# Patient Record
Sex: Female | Born: 1980 | Race: Black or African American | Hispanic: No | Marital: Married | Smoking: Never smoker
Health system: Southern US, Community
[De-identification: ages and names within clinical notes are randomized; demographics above are authoritative.]

## PROBLEM LIST (undated history)

## (undated) DIAGNOSIS — B999 Unspecified infectious disease: Secondary | ICD-10-CM

## (undated) HISTORY — DX: Unspecified infectious disease: B99.9

---

## 2011-07-04 HISTORY — PX: WISDOM TOOTH EXTRACTION: SHX21

## 2011-07-30 ENCOUNTER — Ambulatory Visit (INDEPENDENT_AMBULATORY_CARE_PROVIDER_SITE_OTHER): Payer: 59

## 2011-07-30 DIAGNOSIS — M25569 Pain in unspecified knee: Secondary | ICD-10-CM

## 2011-07-30 DIAGNOSIS — K6289 Other specified diseases of anus and rectum: Secondary | ICD-10-CM

## 2012-05-20 ENCOUNTER — Encounter: Payer: Self-pay | Admitting: Obstetrics and Gynecology

## 2012-05-20 NOTE — Progress Notes (Signed)
Pt presented to office. Was scheduled for NOB interview.  LMP 05/08/12. Pt is not pregnant.  Thought prenatal meant prior to pregnancy. Denies any problems currently or with previous pregnancy. Questions if should take prenatal vitamins and advised may take OTC. Questioning if needed lab tests prior to pregnancy.  Offered pt New GYN or preconception consult.  Pt declined appt.  Will call with +UPT or any questions.

## 2012-07-03 NOTE — L&D Delivery Note (Signed)
Delivery Note At 10:38 AM a viable female was delivered via Vaginal, Spontaneous Delivery (Presentation: Right Occiput Anterior).  APGAR: 8, 9; weight 9 lb 0.1 oz (4085 g).   Placenta status: Intact, Spontaneous.  Cord: 3 vessels with the following complications: None.  Cord pH: n/a  Anesthesia: Epidural  Episiotomy: None Lacerations: 2nd degree;Perineal Suture Repair: 2.0 3.0 vicryl Est. Blood Loss (mL): 300  Mom to postpartum.  Baby to nursery-stable.  Purcell Nails 03/10/2013, 12:16 PM

## 2012-07-22 ENCOUNTER — Ambulatory Visit: Payer: 59 | Admitting: Obstetrics and Gynecology

## 2012-07-22 DIAGNOSIS — Z331 Pregnant state, incidental: Secondary | ICD-10-CM

## 2012-07-22 LAB — POCT URINALYSIS DIPSTICK
Ketones, UA: NEGATIVE
Spec Grav, UA: 1.02
pH, UA: 5

## 2012-07-22 NOTE — Progress Notes (Signed)
NOB interview. 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

## 2012-07-23 LAB — PRENATAL PANEL VII
Antibody Screen: NEGATIVE
Eosinophils Absolute: 0.1 10*3/uL (ref 0.0–0.7)
Eosinophils Relative: 1 % (ref 0–5)
HCT: 38.6 % (ref 36.0–46.0)
Hemoglobin: 13.2 g/dL (ref 12.0–15.0)
Lymphs Abs: 1.8 10*3/uL (ref 0.7–4.0)
MCH: 28 pg (ref 26.0–34.0)
MCV: 82 fL (ref 78.0–100.0)
Monocytes Absolute: 0.3 10*3/uL (ref 0.1–1.0)
Monocytes Relative: 5 % (ref 3–12)
Platelets: 267 10*3/uL (ref 150–400)
RBC: 4.71 MIL/uL (ref 3.87–5.11)
Rh Type: POSITIVE
Rubella: 15.3 Index — ABNORMAL HIGH (ref <0.90)

## 2012-07-23 LAB — VARICELLA ZOSTER ANTIBODY, IGG: Varicella IgG: <10 Index (ref <135.00)

## 2012-07-23 LAB — GC/CHLAMYDIA PROBE AMP, URINE: Chlamydia, Swab/Urine, PCR: NEGATIVE

## 2012-07-24 ENCOUNTER — Encounter: Payer: Self-pay | Admitting: Obstetrics and Gynecology

## 2012-07-24 DIAGNOSIS — Z789 Other specified health status: Secondary | ICD-10-CM | POA: Insufficient documentation

## 2012-07-24 LAB — HEMOGLOBINOPATHY EVALUATION
Hemoglobin Other: 0 %
Hgb A: 97.2 % (ref 96.8–97.8)

## 2012-07-26 LAB — CULTURE, OB URINE

## 2012-07-29 ENCOUNTER — Telehealth: Payer: Self-pay | Admitting: Obstetrics and Gynecology

## 2012-07-29 MED ORDER — ONDANSETRON 8 MG PO TBDP: 8.0000 mg | ORAL_TABLET | Freq: Three times a day (TID) | ORAL | Status: DC | PRN

## 2012-07-29 NOTE — Telephone Encounter (Signed)
Returned pt's VM. States is not able to eat due to nausea but is able to retain water.  Vomiting 1-3 x /day. Is agreeable to try medication.  Prefers Zofran. Advised to wait 30-45 minutes after taking med and then eat and drink small amounts frequently. Also advised to [facility] daily.Per SL, OK to order Zofran.

## 2012-07-30 NOTE — Progress Notes (Signed)
Quick Note:  Please inform + urine culture. Call in Macrobid 100 mg BID for 7 days ______

## 2012-08-01 ENCOUNTER — Telehealth: Payer: Self-pay

## 2012-08-01 MED ORDER — NITROFURANTOIN MONOHYD MACRO 100 MG PO CAPS: 100.0000 mg | ORAL_CAPSULE | Freq: Two times a day (BID) | ORAL | Status: AC

## 2012-08-01 NOTE — Telephone Encounter (Signed)
Message copied by Darien Ramus on Thu Aug 01, 2012 11:00 AM ------      Message from: Silverio Lay      Created: Tue Jul 30, 2012  1:54 PM       Please inform + urine culture. Call in Macrobid 100 mg BID for 7 days

## 2012-08-01 NOTE — Telephone Encounter (Signed)
LVM for pt to return call.   Dwain Huhn, CMA  

## 2012-08-01 NOTE — Telephone Encounter (Signed)
LVM for return call  Rosalin Buster, CMA  

## 2012-08-01 NOTE — Telephone Encounter (Signed)
Pt aware of results and rx. Results mailed to pt.   Darien Ramus, CMA

## 2012-08-06 ENCOUNTER — Telehealth: Payer: Self-pay | Admitting: Obstetrics and Gynecology

## 2012-08-06 NOTE — Telephone Encounter (Signed)
Pt called, states for the past 3 days has been having nausea and vomiting about 3 times a day.  Pt states has Zofran but it is not helping but also reports eating foods like "chips" and drinking "Pepsi".  Pt advised to change her diet, will need to eat more bland, less seasoned foods, drink ginger ale, water, Gatorade/Pedialyte to stay hydrated and stay away from the fried, oily, highly seasoned foods, then try taking meds to see if there is any improvement.  If still no improvement to call office, pt voices agreement.

## 2012-08-07 ENCOUNTER — Telehealth: Payer: Self-pay | Admitting: Obstetrics and Gynecology

## 2012-08-07 ENCOUNTER — Other Ambulatory Visit: Payer: Self-pay | Admitting: Obstetrics and Gynecology

## 2012-08-07 MED ORDER — PROMETHAZINE HCL 25 MG RE SUPP: 25.0000 mg | Freq: Four times a day (QID) | RECTAL | Status: DC | PRN

## 2012-08-07 NOTE — Telephone Encounter (Signed)
Pt called, states is continuing to have N/V and can not keep food down and minimal fluids.  Has been drinking ginger ale, which comes up and can only keep down sips of water.  Pt states she changed her diet as discussed yesterday and tried the Zofran with no relief.  C/w VPH, pt offered Phenergan suppositories.  E-prescribed rx to pt's pharmacy of choice.  Pt advised if continues with same symptoms, pt will need to go to MAU, pt voices agreement.

## 2012-08-08 ENCOUNTER — Encounter: Payer: 59 | Admitting: Obstetrics and Gynecology

## 2012-08-14 ENCOUNTER — Other Ambulatory Visit: Payer: Self-pay | Admitting: Obstetrics and Gynecology

## 2012-08-14 ENCOUNTER — Telehealth: Payer: Self-pay | Admitting: Obstetrics and Gynecology

## 2012-08-14 MED ORDER — PROMETHAZINE HCL 25 MG RE SUPP: 25.0000 mg | Freq: Four times a day (QID) | RECTAL | Status: DC | PRN

## 2012-08-14 NOTE — Telephone Encounter (Signed)
Need rx refill

## 2012-08-14 NOTE — Telephone Encounter (Signed)
Lm on vm to cb per telephone call.  

## 2012-08-14 NOTE — Telephone Encounter (Signed)
Lm on vm to cb per pt returning call.

## 2012-08-14 NOTE — Telephone Encounter (Signed)
Pt called to ask for a RF of Phenergan suppositories.  Pt states that the suppositories are helping with her nausea.  Per VPH ok to give pt refills and advised pt at appt on Monday to make the provider aware if more refills are needed.  Pt voices agreement.

## 2012-08-19 ENCOUNTER — Ambulatory Visit: Payer: 59 | Admitting: Obstetrics and Gynecology

## 2012-08-19 ENCOUNTER — Encounter: Payer: Self-pay | Admitting: Obstetrics and Gynecology

## 2012-08-19 VITALS — BP 110/68 | Ht 64.0 in | Wt 150.0 lb

## 2012-08-19 DIAGNOSIS — O219 Vomiting of pregnancy, unspecified: Secondary | ICD-10-CM

## 2012-08-19 DIAGNOSIS — Z331 Pregnant state, incidental: Secondary | ICD-10-CM

## 2012-08-19 DIAGNOSIS — N390 Urinary tract infection, site not specified: Secondary | ICD-10-CM | POA: Insufficient documentation

## 2012-08-19 DIAGNOSIS — Z124 Encounter for screening for malignant neoplasm of cervix: Secondary | ICD-10-CM

## 2012-08-19 MED ORDER — PROMETHAZINE HCL 25 MG RE SUPP: 25.0000 mg | Freq: Four times a day (QID) | RECTAL | Status: DC | PRN

## 2012-08-19 NOTE — Progress Notes (Signed)
   Ann Everett is being seen today for her first obstetrical visit at [redacted]w[redacted]d gestation by LMP.  She reports overall doing well, but is still struggling with nausea. Phenergan suppositories are working well, needs refill. Last baby born in [place], no complications.  Here in [place] x 2 years.  No female circ per patient.  Her obstetrical history is significant for: Patient Active Problem List  Diagnosis  . Susceptible varicella  . UTI (lower urinary tract infection)    Relationship with FOB:  Husband in involved and supportive. 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  Feeding plan:  Breast Pregnancy history fully reviewed.  The following portions of the patient's history were reviewed and updated as appropriate: allergies, current medications, past family history, past medical history, past social history, past surgical history and problem list.  Review of Systems Pertinent ROS is described in HPI   Objective:   BP 110/68  Ht 5\' 4"  (1.626 m)  Wt 150 lb (68.04 kg)  BMI 25.73 kg/m2  LMP 06/04/2012 Wt Readings from Last 1 Encounters:  08/19/12 150 lb (68.04 kg)   BMI: Body mass index is 25.73 kg/(m^2).  General: alert, cooperative and no distress HEENT: grossly normal  Thyroid: normal  Respiratory: clear to auscultation bilaterally Cardiovascular: regular rate and rhythm,  Breasts:  No dominant masses, nipples erect Gastrointestinal: soft, non-tender; no masses,  no organomegaly Extremities: extremities normal, no pain or edema Vaginal Bleeding: None  EXTERNAL GENITALIA: normal appearing vulva with no masses, tenderness or lesions VAGINA: no abnormal discharge or lesions CERVIX: no lesions or cervical motion tenderness; cervix closed, long, firm UTERUS: gravid and consistent with 10-11 weeks ADNEXA: no masses palpable and nontender OB EXAM PELVIMETRY: appears adequate  FHR:  160  bpm  Assessment:    Pregnancy at  10 6/7 weeks Nausea/vomiting, responsive to  Phenergan suppositories Varicella non-immune Plan:     Prenatal panel reviewed and discussed with the patient:  Yes Offer varicella vaccine pp  Pap smear collected:  yes GC/Chlamydia collected:  yes Wet prep:  Negative. Discussion of Genetic testing options: Declines Prenatal vitamins recommended  Plan of care: Next visit:  4 weeks for ROB Other anticipated f/u:    Anatomy US at 18 weeks   Nigel Bridgeman, CNM, [place]

## 2012-08-19 NOTE — Progress Notes (Signed)
[redacted]w[redacted]d Unable to void pt has h20 Declines genetic screening  Pt received flu vaccine 10/13

## 2012-08-20 LAB — PAP IG W/ RFLX HPV ASCU

## 2012-08-28 ENCOUNTER — Telehealth: Payer: Self-pay | Admitting: Obstetrics and Gynecology

## 2012-08-28 NOTE — Telephone Encounter (Signed)
TC to pt regarding triage message for cold /nasal congestion. Pt made aware of OTC medications that are safe to use in pregnancy Pt has already been using Anefrin and made aware again today not to use more than 3 days Pt told saline nasal spray is safe  Pt voiced understanding.  [facility] CMA

## 2012-09-04 ENCOUNTER — Ambulatory Visit: Payer: 59 | Admitting: Family Medicine

## 2012-09-04 ENCOUNTER — Telehealth: Payer: Self-pay | Admitting: Obstetrics and Gynecology

## 2012-09-04 VITALS — BP 126/82 | HR 101 | Temp 98.8°F | Resp 17 | Ht 64.0 in | Wt 150.0 lb

## 2012-09-04 DIAGNOSIS — R1031 Right lower quadrant pain: Secondary | ICD-10-CM

## 2012-09-04 DIAGNOSIS — J020 Streptococcal pharyngitis: Secondary | ICD-10-CM

## 2012-09-04 DIAGNOSIS — Z331 Pregnant state, incidental: Secondary | ICD-10-CM

## 2012-09-04 LAB — OB RESULTS CONSOLE GBS: GBS: POSITIVE

## 2012-09-04 MED ORDER — AMOXICILLIN 875 MG PO TABS: 875.0000 mg | ORAL_TABLET | Freq: Two times a day (BID) | ORAL | Status: DC

## 2012-09-04 NOTE — Telephone Encounter (Signed)
Pt called regarding msg below.  Pt is 13 wks and says her throat hurts, tonsils are swollen and spit out some blood.  Her child was sick and believes that she caught we she had.  Pt denies any pregnancy sxs, no vaginal bleeding.  Advised pt to go her PCP for eval of her throat and if meds needed they are able to rx.  If there any questions advised pt to call the office.  Pt voices agreement.

## 2012-09-04 NOTE — Patient Instructions (Signed)
Ask the pharmacist for one of the over-the-counter type of throat no means sprays.  Cepacol is probably available, as are some other brands.  Drink plenty of fluids and get enough rest  Take Tylenol or ibuprofen for pain  If the pain of your medial thigh area gets worse we will need to be rechecked.  Take the amoxicillin for the full 10 days.

## 2012-09-04 NOTE — Progress Notes (Signed)
Subjective: Patient is here with several complaints. She has a sore throat. Her daughter had strep 2 days ago. She is pregnant, about 13 weeks. She has seen her gynecologist last month. Everything is going well with that. She is gravida 2 para 1. She's been having pain in the last couple of days in the medial thighs up near the groin area. Knows of no trauma.  Objective: Pleasant [place] lady in no major distress. Her TMs are normal. Throat erythematous, more swollen on the right than the left. Supple without any nuchal rigidity. She is tender in the glans of the neck. Her chest is clear. Heart regular. Abdomen soft, gravid . Is tender on the ligaments of the medial thigh at the groin region. Range of motion of either the right or left hip causes pain. She has no vaginal discharge or dysuria reported.    Results for orders placed in visit on 09/04/12  POCT RAPID STREP A (OFFICE)      Result Value Range   Rapid Strep A Screen Positive (*) Negative   Assessment: Streptococcal pharyngitis Medial thigh pains, possibly myalgias secondary to the infection Pregnancy  Plan: Amoxicillin 875 twice a day Tylenol and/or ibuprofen Return if worse

## 2012-09-10 ENCOUNTER — Telehealth: Payer: Self-pay | Admitting: Obstetrics and Gynecology

## 2012-09-10 NOTE — Telephone Encounter (Signed)
Returned pt's call. States is taking Amoxicillin for tonsillitis .Was told was OK but questioning if 875 mg dose was OK.Per  VL, OK to take. Advised to complete Rx. Pt verbalizes comprehension.

## 2012-09-16 ENCOUNTER — Ambulatory Visit: Payer: 59 | Admitting: Obstetrics and Gynecology

## 2012-09-16 ENCOUNTER — Encounter: Payer: Self-pay | Admitting: Obstetrics and Gynecology

## 2012-09-16 VITALS — BP 108/64 | Wt 152.0 lb

## 2012-09-16 DIAGNOSIS — Z3482 Encounter for supervision of other normal pregnancy, second trimester: Secondary | ICD-10-CM

## 2012-09-16 DIAGNOSIS — Z331 Pregnant state, incidental: Secondary | ICD-10-CM

## 2012-09-16 NOTE — Progress Notes (Signed)
[redacted]w[redacted]d Pt has some back pain x 1 week in low back - no change in discharge Pt unable to void right now  - pt voided and urine sent for TOC Back pain pamphlet Takes 1 tab of tylenol prn to help comfort with sleep and it is working for her PNL reviewed Anatomy scan in 4wks

## 2013-02-12 LAB — OB RESULTS CONSOLE GBS
GBS: NEGATIVE
GBS: NEGATIVE

## 2013-03-10 ENCOUNTER — Encounter (HOSPITAL_COMMUNITY): Payer: Self-pay | Admitting: *Deleted

## 2013-03-10 ENCOUNTER — Inpatient Hospital Stay (HOSPITAL_COMMUNITY): Payer: 59 | Admitting: Anesthesiology

## 2013-03-10 ENCOUNTER — Inpatient Hospital Stay (HOSPITAL_COMMUNITY)
Admission: AD | Admit: 2013-03-10 | Discharge: 2013-03-11 | DRG: 775 | Disposition: A | Discharge status: Home / Self Care | Payer: 59 | Source: Ambulatory Visit | Attending: Obstetrics and Gynecology | Admitting: Obstetrics and Gynecology

## 2013-03-10 ENCOUNTER — Encounter (HOSPITAL_COMMUNITY): Payer: Self-pay | Admitting: Anesthesiology

## 2013-03-10 DIAGNOSIS — N390 Urinary tract infection, site not specified: Secondary | ICD-10-CM

## 2013-03-10 LAB — RPR: RPR Ser Ql: NONREACTIVE

## 2013-03-10 LAB — CBC
HCT: 40.3 % (ref 36.0–46.0)
MCV: 87.4 fL (ref 78.0–100.0)
RDW: 13.3 % (ref 11.5–15.5)
WBC: 8.7 10*3/uL (ref 4.0–10.5)

## 2013-03-10 LAB — TYPE AND SCREEN

## 2013-03-10 MED ORDER — FENTANYL 2.5 MCG/ML BUPIVACAINE 1/10 % EPIDURAL INFUSION (WH - ANES)
14.0000 mL/h | INTRAMUSCULAR | Status: DC | PRN
  Filled 2013-03-10: qty 125

## 2013-03-10 MED ORDER — ONDANSETRON HCL 4 MG PO TABS: 4.0000 mg | ORAL_TABLET | ORAL | Status: DC | PRN

## 2013-03-10 MED ORDER — DIBUCAINE 1 % RE OINT: 1.0000 "application " | TOPICAL_OINTMENT | RECTAL | Status: DC | PRN

## 2013-03-10 MED ORDER — IBUPROFEN 600 MG PO TABS
600.0000 mg | ORAL_TABLET | Freq: Four times a day (QID) | ORAL | Status: DC
  Administered 2013-03-10 – 2013-03-11 (×4): 600 mg via ORAL
  Filled 2013-03-10 (×4): qty 1

## 2013-03-10 MED ORDER — DIPHENHYDRAMINE HCL 50 MG/ML IJ SOLN: 12.5000 mg | INTRAMUSCULAR | Status: DC | PRN

## 2013-03-10 MED ORDER — LACTATED RINGERS IV SOLN: 500.0000 mL | Freq: Once | INTRAVENOUS | Status: DC

## 2013-03-10 MED ORDER — SENNOSIDES-DOCUSATE SODIUM 8.6-50 MG PO TABS
2.0000 | ORAL_TABLET | ORAL | Status: DC
  Administered 2013-03-10: 2 via ORAL

## 2013-03-10 MED ORDER — OXYCODONE-ACETAMINOPHEN 5-325 MG PO TABS: 1.0000 | ORAL_TABLET | ORAL | Status: DC | PRN

## 2013-03-10 MED ORDER — EPHEDRINE 5 MG/ML INJ
10.0000 mg | INTRAVENOUS | Status: DC | PRN
  Filled 2013-03-10: qty 2
  Filled 2013-03-10: qty 4

## 2013-03-10 MED ORDER — LACTATED RINGERS IV SOLN: 500.0000 mL | INTRAVENOUS | Status: DC | PRN

## 2013-03-10 MED ORDER — PHENYLEPHRINE 40 MCG/ML (10ML) SYRINGE FOR IV PUSH (FOR BLOOD PRESSURE SUPPORT)
80.0000 ug | PREFILLED_SYRINGE | INTRAVENOUS | Status: DC | PRN
  Filled 2013-03-10: qty 5
  Filled 2013-03-10: qty 2

## 2013-03-10 MED ORDER — LANOLIN HYDROUS EX OINT: TOPICAL_OINTMENT | CUTANEOUS | Status: DC | PRN

## 2013-03-10 MED ORDER — WITCH HAZEL-GLYCERIN EX PADS: 1.0000 "application " | MEDICATED_PAD | CUTANEOUS | Status: DC | PRN

## 2013-03-10 MED ORDER — CITRIC ACID-SODIUM CITRATE 334-500 MG/5ML PO SOLN: 30.0000 mL | ORAL | Status: DC | PRN

## 2013-03-10 MED ORDER — OXYTOCIN 40 UNITS IN LACTATED RINGERS INFUSION - SIMPLE MED
62.5000 mL/h | INTRAVENOUS | Status: DC
  Filled 2013-03-10 (×2): qty 1000

## 2013-03-10 MED ORDER — LIDOCAINE HCL (PF) 1 % IJ SOLN
30.0000 mL | INTRAMUSCULAR | Status: DC | PRN
  Filled 2013-03-10 (×2): qty 30

## 2013-03-10 MED ORDER — ONDANSETRON HCL 4 MG/2ML IJ SOLN: 4.0000 mg | INTRAMUSCULAR | Status: DC | PRN

## 2013-03-10 MED ORDER — ACETAMINOPHEN 325 MG PO TABS: 650.0000 mg | ORAL_TABLET | ORAL | Status: DC | PRN

## 2013-03-10 MED ORDER — DIPHENHYDRAMINE HCL 25 MG PO CAPS: 25.0000 mg | ORAL_CAPSULE | Freq: Four times a day (QID) | ORAL | Status: DC | PRN

## 2013-03-10 MED ORDER — LIDOCAINE HCL (PF) 1 % IJ SOLN
INTRAMUSCULAR | Status: DC | PRN
  Administered 2013-03-10 (×2): 9 mL

## 2013-03-10 MED ORDER — OXYCODONE-ACETAMINOPHEN 5-325 MG PO TABS
1.0000 | ORAL_TABLET | ORAL | Status: DC | PRN
  Administered 2013-03-10 – 2013-03-11 (×3): 1 via ORAL
  Filled 2013-03-10: qty 1
  Filled 2013-03-10: qty 2
  Filled 2013-03-10: qty 1

## 2013-03-10 MED ORDER — PHENYLEPHRINE 40 MCG/ML (10ML) SYRINGE FOR IV PUSH (FOR BLOOD PRESSURE SUPPORT)
80.0000 ug | PREFILLED_SYRINGE | INTRAVENOUS | Status: DC | PRN
  Administered 2013-03-10: 80 ug via INTRAVENOUS
  Filled 2013-03-10: qty 2

## 2013-03-10 MED ORDER — FENTANYL 2.5 MCG/ML BUPIVACAINE 1/10 % EPIDURAL INFUSION (WH - ANES)
INTRAMUSCULAR | Status: DC | PRN
  Administered 2013-03-10: 14 mL/h via EPIDURAL

## 2013-03-10 MED ORDER — LACTATED RINGERS IV SOLN
INTRAVENOUS | Status: DC
  Administered 2013-03-10: 06:00:00 via INTRAVENOUS

## 2013-03-10 MED ORDER — OXYTOCIN BOLUS FROM INFUSION
500.0000 mL | INTRAVENOUS | Status: DC
  Administered 2013-03-10: 500 mL via INTRAVENOUS

## 2013-03-10 MED ORDER — ZOLPIDEM TARTRATE 5 MG PO TABS: 5.0000 mg | ORAL_TABLET | Freq: Every evening | ORAL | Status: DC | PRN

## 2013-03-10 MED ORDER — EPHEDRINE 5 MG/ML INJ
10.0000 mg | INTRAVENOUS | Status: DC | PRN
  Filled 2013-03-10: qty 2

## 2013-03-10 MED ORDER — BENZOCAINE-MENTHOL 20-0.5 % EX AERO
1.0000 "application " | INHALATION_SPRAY | CUTANEOUS | Status: DC | PRN
  Administered 2013-03-10: 1 via TOPICAL
  Filled 2013-03-10 (×2): qty 56

## 2013-03-10 MED ORDER — PRENATAL MULTIVITAMIN CH
1.0000 | ORAL_TABLET | Freq: Every day | ORAL | Status: DC
  Administered 2013-03-11: 1 via ORAL
  Filled 2013-03-10: qty 1

## 2013-03-10 MED ORDER — IBUPROFEN 600 MG PO TABS
600.0000 mg | ORAL_TABLET | Freq: Four times a day (QID) | ORAL | Status: DC | PRN
  Administered 2013-03-10: 600 mg via ORAL
  Filled 2013-03-10: qty 1

## 2013-03-10 MED ORDER — SIMETHICONE 80 MG PO CHEW: 80.0000 mg | CHEWABLE_TABLET | ORAL | Status: DC | PRN

## 2013-03-10 MED ORDER — TETANUS-DIPHTH-ACELL PERTUSSIS 5-2.5-18.5 LF-MCG/0.5 IM SUSP: 0.5000 mL | Freq: Once | INTRAMUSCULAR | Status: DC

## 2013-03-10 MED ORDER — ONDANSETRON HCL 4 MG/2ML IJ SOLN: 4.0000 mg | Freq: Four times a day (QID) | INTRAMUSCULAR | Status: DC | PRN

## 2013-03-10 NOTE — Anesthesia Postprocedure Evaluation (Signed)
Anesthesia Post Note  Patient: Ann Everett  Procedure(s) Performed: * No procedures listed *  Anesthesia type: Epidural  Patient location: Mother/Baby  Post pain: Pain level controlled  Post assessment: Post-op Vital signs reviewed  Last Vitals:  Filed Vitals:   03/10/13 1345  BP: 113/72  Pulse: 89  Temp: 36.9 C  Resp: 18    Post vital signs: Reviewed  Level of consciousness: awake  Complications: No apparent anesthesia complications

## 2013-03-10 NOTE — Progress Notes (Signed)
Patient ID: Floria Brandau, female   DOB: May 03, 1981, 32 y.o.   MRN: [id number] Rabecka Brendel is a 32 y.o. G2P1001 at 104w6d admitted for labor  Subjective: Comfortable w epidural, family at [facility]  Objective: BP 126/67  Pulse 86  Temp(Src) 97.7 F (36.5 C) (Oral)  Resp 18  Ht 5\' 3"  (1.6 m)  Wt 189 lb (85.73 kg)  BMI 33.49 kg/m2  SpO2 100%  LMP 06/04/2012     FHT:  FHR: 150 bpm, variability: moderate,  accelerations:  Present,  decelerations:  Present series of late decels, now resolved UC:   regular, every 2-4 minutes SVE:   8/100/-2 SROM w vag exam, lg amt clear fluid  vtx better applied after ROM    Assessment / Plan: Spontaneous labor, progressing normally  Labor: Progressing normally Preeclampsia:  no s/s Fetal Wellbeing:  Category I and Category II Pain Control:  Epidural Anticipated MOD:  NSVD  Pt positioned in HF to encourage fetal descent  Recheck prn urge to push  Update physician PRN   Farryn Linares M 03/10/2013, 5:42 AM

## 2013-03-10 NOTE — Anesthesia Preprocedure Evaluation (Signed)
Anesthesia Evaluation  Patient identified by MRN, date of birth, ID band Patient awake    Reviewed: Allergy & Precautions, H&P , NPO status , Patient's Chart, lab work & pertinent test results  Airway Mallampati: II TM Distance: >3 FB Neck ROM: full    Dental no notable dental hx.    Pulmonary neg pulmonary ROS,    Pulmonary exam normal       Cardiovascular negative cardio ROS      Neuro/Psych negative neurological ROS  negative psych ROS   GI/Hepatic negative GI ROS, Neg liver ROS,   Endo/Other  negative endocrine ROS  Renal/GU negative Renal ROS  negative genitourinary   Musculoskeletal negative musculoskeletal ROS (+)   Abdominal Normal abdominal exam  (+)   Peds  Hematology negative hematology ROS (+)   Anesthesia Other Findings   Reproductive/Obstetrics (+) Pregnancy                           Anesthesia Physical Anesthesia Plan  ASA: II  Anesthesia Plan: Epidural   Post-op Pain Management:    Induction:   Airway Management Planned:   Additional Equipment:   Intra-op Plan:   Post-operative Plan:   Informed Consent: I have reviewed the patients History and Physical, chart, labs and discussed the procedure including the risks, benefits and alternatives for the proposed anesthesia with the patient or authorized representative who has indicated his/her understanding and acceptance.     Plan Discussed with:   Anesthesia Plan Comments:         Anesthesia Quick Evaluation  

## 2013-03-10 NOTE — Anesthesia Procedure Notes (Signed)
Epidural Patient location during procedure: OB Start time: 03/10/2013 4:39 AM End time: 03/10/2013 4:43 AM  Staffing Anesthesiologist: Sandrea Hughs Performed by: anesthesiologist   Preanesthetic Checklist Completed: patient identified, surgical consent, pre-op evaluation, timeout performed, IV checked, risks and benefits discussed and monitors and equipment checked  Epidural Patient position: sitting Prep: site prepped and draped and DuraPrep Patient monitoring: continuous pulse ox and blood pressure Approach: midline Injection technique: LOR air  Needle:  Needle type: Tuohy  Needle gauge: 17 G Needle length: 9 cm and 9 Needle insertion depth: 4 cm Catheter type: closed end flexible Catheter size: 19 Gauge Catheter at skin depth: 9 cm Test dose: negative and Other  Assessment Sensory level: T9 Events: blood not aspirated, injection not painful, no injection resistance, negative IV test and no paresthesia  Additional Notes Reason for block:procedure for pain

## 2013-03-10 NOTE — MAU Note (Signed)
Pt having contractions. 

## 2013-03-10 NOTE — H&P (Signed)
Lenee Pingley is a 32 y.o. female presenting for labor. History OB History   Grav Para Term Preterm Abortions TAB SAB Ect Mult Living   2 1 1       1      Past Medical History  Diagnosis Date  . Infection     UTI   Past Surgical History  Procedure Laterality Date  . Wisdom tooth extraction  2013   Family History: family history is not on file. Social History:  reports that she has never smoked. She has never used smokeless tobacco. She reports that she does not drink alcohol or use illicit drugs.   Prenatal Transfer Tool  Maternal Diabetes: No Genetic Screening: Declined Maternal Ultrasounds/Referrals: Normal Fetal Ultrasounds or other Referrals:  None Maternal Substance Abuse:  No Significant Maternal Medications:  None Significant Maternal Lab Results:  Lab values include: Group B Strep negative Other Comments:  None  ROS  Dilation: 6 Effacement (%): 90 Station: -2 Exam by:: Rudi Coco RN Blood pressure 135/83, pulse 99, temperature 98.7 F (37.1 C), temperature source Oral, resp. rate 18, height 5\' 4"  (1.626 m), weight 195 lb (88.451 kg), last menstrual period 06/04/2012. Exam Physical Exam  Prenatal labs: ABO, Rh: A/POS/-- (01/20 4098) Antibody: NEG (01/20 0958) Rubella: 15.30 (01/20 0958) RPR: NON REAC (01/20 0958)  HBsAg: NEGATIVE (01/20 0958)  HIV: NON REACTIVE (01/20 0958)  GBS:   neg 8/13   Assessment/Plan: IUP at [redacted]w[redacted]d GBS neg FHR reassuring Active labor  Admit to b.s. Per c/w Dr Pennie Rushing Routine L&D orders Epidural ASAP Expectant mgmnt Anticipate NSVD    Jshon Ibe M 03/10/2013, 3:37 AM

## 2013-03-11 LAB — CBC
HCT: 27.9 % — ABNORMAL LOW (ref 36.0–46.0)
Hemoglobin: 9.6 g/dL — ABNORMAL LOW (ref 12.0–15.0)
MCH: 30.2 pg (ref 26.0–34.0)
MCHC: 34.4 g/dL (ref 30.0–36.0)
RDW: 13.6 % (ref 11.5–15.5)

## 2013-03-11 MED ORDER — OXYCODONE-ACETAMINOPHEN 5-325 MG PO TABS: 1.0000 | ORAL_TABLET | Freq: Four times a day (QID) | ORAL | Status: DC | PRN

## 2013-03-11 MED ORDER — IBUPROFEN 600 MG PO TABS: 600.0000 mg | ORAL_TABLET | Freq: Four times a day (QID) | ORAL | Status: DC | PRN

## 2013-03-11 MED ORDER — BENZOCAINE-MENTHOL 20-0.5 % EX AERO: 1.0000 "application " | INHALATION_SPRAY | CUTANEOUS | Status: DC | PRN

## 2013-03-11 NOTE — Lactation Note (Signed)
This note was copied from the chart of Ann Danie Hannig. Lactation Consultation Note Mom states breast feeding is going very well. Mom holding baby in cradle hold when I enter room, baby just finishing a feeding. Mom admits to mild nipple pain. Offered to assist with latch, mom accepts. Readjusted baby to be higher and in cross cradle, added pillow support, and assisted baby to get a deeper latch; mom states more comfortable. Enc mom to call lactation office if she has any concerns, and to attend the BFSG.  Patient Name: Ann Everett [id number] Date: 03/11/2013 Reason for consult: Initial assessment;Follow-up assessment   Maternal Data    Feeding Feeding Type: Breast Milk Length of feed: 20 min  LATCH Score/Interventions Latch: Grasps breast easily, tongue down, lips flanged, rhythmical sucking. Intervention(s): Skin to skin;Teach feeding cues;Waking techniques Intervention(s): Adjust position  Audible Swallowing: Spontaneous and intermittent  Type of Nipple: Everted at rest and after stimulation  Comfort (Breast/Nipple): Filling, red/small blisters or bruises, mild/mod discomfort  Problem noted: Mild/Moderate discomfort Interventions (Mild/moderate discomfort):  (adjust position)  Hold (Positioning): Assistance needed to correctly position infant at breast and maintain latch.  LATCH Score: 8  Lactation Tools Discussed/Used     Consult Status Consult Status: Complete    Lenard Forth 03/11/2013, 2:33 PM

## 2013-03-11 NOTE — Anesthesia Postprocedure Evaluation (Signed)
  Anesthesia Post-op Note  Patient: Ann Everett  Procedure(s) Performed: * No procedures listed *  Patient Location: PACU and Mother/Baby  Anesthesia Type:Epidural  Level of Consciousness: awake, alert  and oriented  Airway and Oxygen Therapy: Patient Spontanous Breathing  Post-op Pain: none  Post-op Assessment: Post-op Vital signs reviewed, Patient's Cardiovascular Status Stable, No headache, No backache, No residual numbness and No residual motor weakness  Post-op Vital Signs: Reviewed and stable  Complications: No apparent anesthesia complications

## 2013-03-11 NOTE — Discharge Summary (Addendum)
Obstetric Discharge Summary Reason for Admission: onset of labor Prenatal Procedures: ultrasound Intrapartum Procedures: spontaneous vaginal delivery Postpartum Procedures: none Complications-Operative and Postpartum: 2nd degree perineal laceration Hemoglobin  Date Value Range Status  03/11/2013 9.6* 12.0 - 15.0 g/dL Final     DELTA CHECK NOTED     REPEATED TO VERIFY     HCT  Date Value Range Status  03/11/2013 27.9* 36.0 - 46.0 % Final   Pt denies HA, light headedness or dizziness with ambulation.  She said she felt a little dizzy yesterday but was fine last night and went back and forth to bathroom without assistance and felt fine.  This morning as well.  + flatus and bleeding is ok she says.  She plans outpatient circ.  Undecided currently about BC.  She requests discharge today and says she has help at home.  Baby has already been discharged. Bp 98-120s/60s-70s.  Physical Exam:  General: alert and no distress Lochia: appropriate Uterine Fundus: firm Incision: n/a DVT Evaluation: No evidence of DVT seen on physical exam.  Discharge Diagnoses: Term Pregnancy-delivered  Discharge Information: Date: 03/11/2013 Activity: pelvic rest Diet: routine Medications: Ibuprofen, Percocet and dermoplast (pt request) and rec OTC iron bid with colace Condition: stable Instructions: refer to practice specific booklet Discharge to: home Follow-up Information   Follow up with Purcell Nails, MD In 6 weeks. (for post partum visit)    Specialty:  Obstetrics and Gynecology   Contact information:   [address] [place] [address] [phone]       Newborn Data: Live born female  Birth Weight: 9 lb 0.1 oz (4085 g) APGAR: 8, 9  Home with mother.  Purcell Nails 03/11/2013, 12:20 PM

## 2014-05-04 ENCOUNTER — Encounter (HOSPITAL_COMMUNITY): Payer: Self-pay | Admitting: *Deleted

## 2014-05-25 ENCOUNTER — Ambulatory Visit (INDEPENDENT_AMBULATORY_CARE_PROVIDER_SITE_OTHER): Payer: 59 | Admitting: Internal Medicine

## 2014-05-25 VITALS — BP 120/74 | HR 71 | Temp 98.1°F | Resp 16 | Ht 64.0 in | Wt 152.6 lb

## 2014-05-25 DIAGNOSIS — J039 Acute tonsillitis, unspecified: Secondary | ICD-10-CM

## 2014-05-25 DIAGNOSIS — R05 Cough: Secondary | ICD-10-CM

## 2014-05-25 DIAGNOSIS — R059 Cough, unspecified: Secondary | ICD-10-CM

## 2014-05-25 LAB — POCT RAPID STREP A (OFFICE): RAPID STREP A SCREEN: NEGATIVE

## 2014-05-25 MED ORDER — HYDROCODONE-ACETAMINOPHEN 7.5-325 MG/15ML PO SOLN: 10.0000 mL | Freq: Four times a day (QID) | ORAL | Status: DC | PRN

## 2014-05-25 MED ORDER — AZITHROMYCIN 500 MG PO TABS: 500.0000 mg | ORAL_TABLET | Freq: Every day | ORAL | Status: DC

## 2014-05-25 NOTE — Progress Notes (Signed)
   Subjective:    Patient ID: Fonnie Birkenheadomadur Blitzer, female    DOB: 1981-02-10, 33 y.o.   MRN: [id number]  HPI 33 year old [place] female is here today with complaints of sore throat for one week, headache, cough, little chest congestion, nasal congestion for three days. She states she produced greenish mucous and phgelm. She has no other complaints. Throat is getting progressively worse. She had flu shot this year.  Review of Systems     Objective:   Physical Exam  Constitutional: She is oriented to person, place, and time. She appears well-developed and well-nourished. No distress.  HENT:  Head: Normocephalic.  Right Ear: External ear normal.  Left Ear: External ear normal.  Mouth/Throat: Uvula is midline. Uvula swelling present. Oropharyngeal exudate, posterior oropharyngeal edema and posterior oropharyngeal erythema present.  Eyes: Conjunctivae and EOM are normal. Pupils are equal, round, and reactive to light.  Neck: Normal range of motion. Neck supple.  Cardiovascular: Normal rate.   Pulmonary/Chest: Effort normal and breath sounds normal.  Musculoskeletal: Normal range of motion.  Lymphadenopathy:    She has cervical adenopathy.  Neurological: She is alert and oriented to person, place, and time. No cranial nerve deficit. She exhibits normal muscle tone. Coordination normal.  Psychiatric: She has a normal mood and affect.  Vitals reviewed.   Results for orders placed or performed in visit on 05/25/14  POCT rapid strep A  Result Value Ref Range   Rapid Strep A Screen Negative Negative         Assessment & Plan:  Tonsillitis Cough Zithromax/Lortab elixir

## 2014-05-25 NOTE — Patient Instructions (Signed)
Cough, Adult  A cough is a reflex that helps clear your throat and airways. It can help heal the body or may be a reaction to an irritated airway. A cough may only last 2 or 3 weeks (acute) or may last more than 8 weeks (chronic).  CAUSES Acute cough:  Viral or bacterial infections. Chronic cough:  Infections.  Allergies.  Asthma.  Post-nasal drip.  Smoking.  Heartburn or acid reflux.  Some medicines.  Chronic lung problems (COPD).  Cancer. SYMPTOMS   Cough.  Fever.  Chest pain.  Increased breathing rate.  High-pitched whistling sound when breathing (wheezing).  Colored mucus that you cough up (sputum). TREATMENT   A bacterial cough may be treated with antibiotic medicine.  A viral cough must run its course and will not respond to antibiotics.  Your caregiver may recommend other treatments if you have a chronic cough. HOME CARE INSTRUCTIONS   Only take over-the-counter or prescription medicines for pain, discomfort, or fever as directed by your caregiver. Use cough suppressants only as directed by your caregiver.  Use a cold steam vaporizer or humidifier in your bedroom or home to help loosen secretions.  Sleep in a semi-upright position if your cough is worse at night.  Rest as needed.  Stop smoking if you smoke. SEEK IMMEDIATE MEDICAL CARE IF:   You have pus in your sputum.  Your cough starts to worsen.  You cannot control your cough with suppressants and are losing sleep.  You begin coughing up blood.  You have difficulty breathing.  You develop pain which is getting worse or is uncontrolled with medicine.  You have a fever. MAKE SURE YOU:   Understand these instructions.  Will watch your condition.  Will get help right away if you are not doing well or get worse. Document Released: 12/16/2010 Document Revised: 09/11/2011 Document Reviewed: 12/16/2010 [facility] Patient Information 2015 [place], [place]. This information is not intended  to replace advice given to you by your health care provider. Make sure you discuss any questions you have with your health care provider. Tonsillitis Tonsillitis is an infection of the throat that causes the tonsils to become red, tender, and swollen. Tonsils are collections of lymphoid tissue at the back of the throat. Each tonsil has crevices (crypts). Tonsils help fight nose and throat infections and keep infection from spreading to other parts of the body for the first 18 months of life.  CAUSES Sudden (acute) tonsillitis is usually caused by infection with streptococcal bacteria. Long-lasting (chronic) tonsillitis occurs when the crypts of the tonsils become filled with pieces of food and bacteria, which makes it easy for the tonsils to become repeatedly infected. SYMPTOMS  Symptoms of tonsillitis include:  A sore throat, with possible difficulty swallowing.  White patches on the tonsils.  Fever.  Tiredness.  New episodes of snoring during sleep, when you did not snore before.  Small, foul-smelling, yellowish-white pieces of material (tonsilloliths) that you occasionally cough up or spit out. The tonsilloliths can also cause you to have bad breath. DIAGNOSIS Tonsillitis can be diagnosed through a physical exam. Diagnosis can be confirmed with the results of lab tests, including a throat culture. TREATMENT  The goals of tonsillitis treatment include the reduction of the severity and duration of symptoms and prevention of associated conditions. Symptoms of tonsillitis can be improved with the use of steroids to reduce the swelling. Tonsillitis caused by bacteria can be treated with antibiotic medicines. Usually, treatment with antibiotic medicines is started before the cause  of the tonsillitis is known. However, if it is determined that the cause is not bacterial, antibiotic medicines will not treat the tonsillitis. If attacks of tonsillitis are severe and frequent, your health care provider  may recommend surgery to remove the tonsils (tonsillectomy). HOME CARE INSTRUCTIONS   Rest as much as possible and get plenty of sleep.  Drink plenty of fluids. While the throat is very sore, eat soft foods or liquids, such as sherbet, soups, or instant breakfast drinks.  Eat frozen ice pops.  Gargle with a warm or cold liquid to help soothe the throat. Mix 1/4 teaspoon of salt and 1/4 teaspoon of baking soda in 8 oz of water. SEEK MEDICAL CARE IF:   Large, tender lumps develop in your neck.  A rash develops.  A green, yellow-brown, or bloody substance is coughed up.  You are unable to swallow liquids or food for 24 hours.  You notice that only one of the tonsils is swollen. SEEK IMMEDIATE MEDICAL CARE IF:   You develop any new symptoms such as vomiting, severe headache, stiff neck, chest pain, or trouble breathing or swallowing.  You have severe throat pain along with drooling or voice changes.  You have severe pain, unrelieved with recommended medications.  You are unable to fully open the mouth.  You develop redness, swelling, or severe pain anywhere in the neck.  You have a fever. MAKE SURE YOU:   Understand these instructions.  Will watch your condition.  Will get help right away if you are not doing well or get worse. Document Released: 03/29/2005 Document Revised: 11/03/2013 Document Reviewed: 12/06/2012 [facility] Patient Information 2015 [place], [place]. This information is not intended to replace advice given to you by your health care provider. Make sure you discuss any questions you have with your health care provider.

## 2014-09-15 ENCOUNTER — Ambulatory Visit (INDEPENDENT_AMBULATORY_CARE_PROVIDER_SITE_OTHER): Payer: 59 | Admitting: Physician Assistant

## 2014-09-15 VITALS — BP 108/84 | HR 81 | Temp 98.9°F | Resp 16 | Ht 64.5 in | Wt 156.0 lb

## 2014-09-15 DIAGNOSIS — J020 Streptococcal pharyngitis: Secondary | ICD-10-CM | POA: Diagnosis not present

## 2014-09-15 DIAGNOSIS — J351 Hypertrophy of tonsils: Secondary | ICD-10-CM

## 2014-09-15 DIAGNOSIS — J029 Acute pharyngitis, unspecified: Secondary | ICD-10-CM

## 2014-09-15 LAB — POCT RAPID STREP A (OFFICE): Rapid Strep A Screen: POSITIVE — AB

## 2014-09-15 MED ORDER — AMOXICILLIN 500 MG PO CAPS: 500.0000 mg | ORAL_CAPSULE | Freq: Two times a day (BID) | ORAL | Status: DC

## 2014-09-15 NOTE — Patient Instructions (Signed)
Your throat swab was positive for strep throat. Please take the amoxicillin twice daily for 10 days. Tylenol every 4-6 hours as needed for pain will help. Please return to clinic if you're not feeling better in a few days. Please go to the ER asap if you start having trouble swallowing your own saliva, start drooling, have a change in your voice, or have neck swelling.   Strep Throat Strep throat is an infection of the throat caused by a bacteria named Streptococcus pyogenes. Your health care provider may call the infection streptococcal "tonsillitis" or "pharyngitis" depending on whether there are signs of inflammation in the tonsils or back of the throat. Strep throat is most common in children aged 5-15 years during the cold months of the year, but it can occur in people of any age during any season. This infection is spread from person to person (contagious) through coughing, sneezing, or other close contact. SIGNS AND SYMPTOMS   Fever or chills.  Painful, swollen, red tonsils or throat.  Pain or difficulty when swallowing.  White or yellow spots on the tonsils or throat.  Swollen, tender lymph nodes or "glands" of the neck or under the jaw.  Red rash all over the body (rare). DIAGNOSIS  Many different infections can cause the same symptoms. A test must be done to confirm the diagnosis so the right treatment can be given. A "rapid strep test" can help your health care provider make the diagnosis in a few minutes. If this test is not available, a light swab of the infected area can be used for a throat culture test. If a throat culture test is done, results are usually available in a day or two. TREATMENT  Strep throat is treated with antibiotic medicine. HOME CARE INSTRUCTIONS   Gargle with 1 tsp of salt in 1 cup of warm water, 3-4 times per day or as needed for comfort.  Family members who also have a sore throat or fever should be tested for strep throat and treated with antibiotics  if they have the strep infection.  Make sure everyone in your household washes their hands well.  Do not share food, drinking cups, or personal items that could cause the infection to spread to others.  You may need to eat a soft food diet until your sore throat gets better.  Drink enough water and fluids to keep your urine clear or pale yellow. This will help prevent dehydration.  Get plenty of rest.  Stay home from school, day care, or work until you have been on antibiotics for 24 hours.  Take medicines only as directed by your health care provider.  Take your antibiotic medicine as directed by your health care provider. Finish it even if you start to feel better. SEEK MEDICAL CARE IF:   The glands in your neck continue to enlarge.  You develop a rash, cough, or earache.  You cough up green, yellow-brown, or bloody sputum.  You have pain or discomfort not controlled by medicines.  Your problems seem to be getting worse rather than better.  You have a fever. SEEK IMMEDIATE MEDICAL CARE IF:   You develop any new symptoms such as vomiting, severe headache, stiff or painful neck, chest pain, shortness of breath, or trouble swallowing.  You develop severe throat pain, drooling, or changes in your voice.  You develop swelling of the neck, or the skin on the neck becomes red and tender.  You develop signs of dehydration, such as fatigue, dry  mouth, and decreased urination.  You become increasingly sleepy, or you cannot wake up completely. MAKE SURE YOU:  Understand these instructions.  Will watch your condition.  Will get help right away if you are not doing well or get worse. Document Released: 06/16/2000 Document Revised: 11/03/2013 Document Reviewed: 08/18/2010 [facility] Patient Information 2015 [place], [place]. This information is not intended to replace advice given to you by your health care provider. Make sure you discuss any questions you have with your health care  provider.

## 2014-09-15 NOTE — Progress Notes (Signed)
   Subjective:    Patient ID: Ann Everett, female    DOB: 07-Aug-1980, 34 y.o.   MRN: [id number]  Chief Complaint  Patient presents with  . Sore Throat    started on yesterday   Patient Active Problem List   Diagnosis Date Noted  . Susceptible varicella 07/24/2012   Medications, allergies, past medical history, surgical history, family history, social history and problem list reviewed and updated.  HPI  89 yom with no significant pmh presents with sore throat since yest.   Sx started yest. Feels that right side back throat is painful. Feels that right ear also mildly painful. Denies assoc cough, congestion, rhinorrhea, vomiting, or diarrhea. Denies abd pain. Mild nausea past few days. No known sick contacts.   Denies fever at home but has been taking ibuprofen scheduled. Denies inability to swallow secretions, drooling, change in voice, or neck swelling.   Review of Systems No cp, sob.     Objective:   Physical Exam  Constitutional: She appears well-developed and well-nourished.  Non-toxic appearance. She does not have a sickly appearance. She does not appear ill. No distress.  BP 108/84 mmHg  Pulse 81  Temp(Src) 98.9 F (37.2 C) (Oral)  Resp 16  Ht 5' 4.5" (1.638 m)  Wt 156 lb (70.761 kg)  BMI 26.37 kg/m2  SpO2 100%  LMP 09/04/2014   HENT:  Right Ear: Tympanic membrane normal. No tenderness. Tympanic membrane is not erythematous.  Left Ear: Tympanic membrane normal. No tenderness. Tympanic membrane is not erythematous.  Nose: Nose normal. Right sinus exhibits no maxillary sinus tenderness and no frontal sinus tenderness. Left sinus exhibits no maxillary sinus tenderness and no frontal sinus tenderness.  Mouth/Throat: Uvula is midline and mucous membranes are normal. No trismus in the jaw. Posterior oropharyngeal erythema present. No oropharyngeal exudate, posterior oropharyngeal edema or tonsillar abscesses.    Right tonsil 3+ with erythema and erythema posterior  pharynx. No exudates.   Neck: No Brudzinski's sign noted.  Pulmonary/Chest: Effort normal and breath sounds normal.  Lymphadenopathy:       Head (right side): No submental, no submandibular and no preauricular adenopathy present.       Head (left side): No submental, no submandibular and no preauricular adenopathy present.    She has cervical adenopathy.       Right cervical: Superficial cervical adenopathy present. No posterior cervical adenopathy present.      Left cervical: Superficial cervical adenopathy present. No posterior cervical adenopathy present.  Mild LAN anterior cervical chain bilaterally. Mild ttp.    Results for orders placed or performed in visit on 09/15/14  POCT rapid strep A  Result Value Ref Range   Rapid Strep A Screen Positive (A) Negative      Assessment & Plan:   [age] yom with no significant pmh presents with sore throat since yest.   Sore throat - Plan: POCT rapid strep A Swollen tonsil - Plan: POCT rapid strep A Streptococcal sore throat - Plan: amoxicillin (AMOXIL) 500 MG capsule --3/4 centor criteria with anterior tender LAN, erythema pharynx, lack of cough --swab positive for strep --amox bid 10 days --tylenol prn for pain --doubt peritonsillar abscess with no trismus, drooling, inability to swallow secretions, voice change, swollen neck --ER with any of above sx  Donnajean Lopesodd M. Angellee Cohill, PA-C Physician Assistant-Certified [facility]  09/15/2014 9:32 PM

## 2019-03-29 ENCOUNTER — Inpatient Hospital Stay (HOSPITAL_COMMUNITY): Payer: 59

## 2019-03-29 ENCOUNTER — Inpatient Hospital Stay (HOSPITAL_COMMUNITY)
Admission: AD | Admit: 2019-03-29 | Discharge: 2019-03-29 | Disposition: A | Discharge status: Home / Self Care | Payer: 59 | Attending: Obstetrics & Gynecology | Admitting: Obstetrics & Gynecology

## 2019-03-29 ENCOUNTER — Encounter (HOSPITAL_COMMUNITY): Payer: Self-pay

## 2019-03-29 ENCOUNTER — Other Ambulatory Visit: Payer: Self-pay

## 2019-03-29 DIAGNOSIS — O3680X0 Pregnancy with inconclusive fetal viability, not applicable or unspecified: Secondary | ICD-10-CM | POA: Diagnosis not present

## 2019-03-29 DIAGNOSIS — O26891 Other specified pregnancy related conditions, first trimester: Secondary | ICD-10-CM | POA: Insufficient documentation

## 2019-03-29 DIAGNOSIS — Z3A01 Less than 8 weeks gestation of pregnancy: Secondary | ICD-10-CM | POA: Diagnosis not present

## 2019-03-29 DIAGNOSIS — O209 Hemorrhage in early pregnancy, unspecified: Secondary | ICD-10-CM | POA: Insufficient documentation

## 2019-03-29 DIAGNOSIS — O4690 Antepartum hemorrhage, unspecified, unspecified trimester: Secondary | ICD-10-CM

## 2019-03-29 DIAGNOSIS — R109 Unspecified abdominal pain: Secondary | ICD-10-CM | POA: Diagnosis not present

## 2019-03-29 DIAGNOSIS — R1030 Lower abdominal pain, unspecified: Secondary | ICD-10-CM | POA: Diagnosis not present

## 2019-03-29 DIAGNOSIS — O020 Blighted ovum and nonhydatidiform mole: Secondary | ICD-10-CM

## 2019-03-29 DIAGNOSIS — O4691 Antepartum hemorrhage, unspecified, first trimester: Secondary | ICD-10-CM | POA: Diagnosis not present

## 2019-03-29 DIAGNOSIS — M549 Dorsalgia, unspecified: Secondary | ICD-10-CM | POA: Insufficient documentation

## 2019-03-29 LAB — WET PREP, GENITAL
Clue Cells Wet Prep HPF POC: NONE SEEN
Sperm: NONE SEEN
Trich, Wet Prep: NONE SEEN
Yeast Wet Prep HPF POC: NONE SEEN

## 2019-03-29 LAB — CBC
HCT: 39 % (ref 36.0–46.0)
Hemoglobin: 13.4 g/dL (ref 12.0–15.0)
MCH: 29.5 pg (ref 26.0–34.0)
MCHC: 34.4 g/dL (ref 30.0–36.0)
MCV: 85.9 fL (ref 80.0–100.0)
Platelets: 244 10*3/uL (ref 150–400)
RBC: 4.54 MIL/uL (ref 3.87–5.11)
RDW: 13.1 % (ref 11.5–15.5)
WBC: 7.5 10*3/uL (ref 4.0–10.5)
nRBC: 0 % (ref 0.0–0.2)

## 2019-03-29 LAB — URINALYSIS, ROUTINE W REFLEX MICROSCOPIC
Bilirubin Urine: NEGATIVE
Glucose, UA: NEGATIVE mg/dL
Ketones, ur: 5 mg/dL — AB
Nitrite: NEGATIVE
Protein, ur: NEGATIVE mg/dL
RBC / HPF: >50 RBC/hpf — ABNORMAL HIGH (ref 0–5)
Specific Gravity, Urine: 1.014 (ref 1.005–1.030)
pH: 5 (ref 5.0–8.0)

## 2019-03-29 LAB — POCT PREGNANCY, URINE: Preg Test, Ur: POSITIVE — AB

## 2019-03-29 LAB — HCG, QUANTITATIVE, PREGNANCY: hCG, Beta Chain, Quant, S: 1597 m[IU]/mL — ABNORMAL HIGH (ref <5)

## 2019-03-29 MED ORDER — ACETAMINOPHEN 500 MG PO TABS
1000.0000 mg | ORAL_TABLET | Freq: Once | ORAL | Status: AC
  Administered 2019-03-29: 19:00:00 1000 mg via ORAL
  Filled 2019-03-29: qty 2

## 2019-03-29 NOTE — MAU Provider Note (Addendum)
History     CSN: [id number]  Arrival date and time: 03/29/19 1744   First Provider Initiated Contact with Patient 03/29/19 1846      Chief Complaint  Patient presents with  . Abdominal Pain  . Back Pain  . Vaginal Bleeding   Ann Everett is a 38 y.o. G3P2 at [redacted]w[redacted]d by LMP who presents to MAU with complaints of abdominal pain and vaginal bleeding. She reports vaginal bleeding has been occurring for the past week started off as light pink spotting then today became heavier and bright red. She denies having to wear a pad or panty liner, reports bleeding occurring when wiping. Denies passing clots. She reports lower abdominal cramping that radiates to her back that started yesterday, rates pain 7/10- has not taken any medication for pain.    OB History    Gravida  3   Para  2   Term  2   Preterm      AB      Living  2     SAB      TAB      Ectopic      Multiple      Live Births  2           Past Medical History:  Diagnosis Date  . Infection    UTI    Past Surgical History:  Procedure Laterality Date  . WISDOM TOOTH EXTRACTION  2013    Family History  Problem Relation Age of Onset  . Dementia Father     Social History   Tobacco Use  . Smoking status: Never Smoker  . Smokeless tobacco: Never Used  Substance Use Topics  . Alcohol use: No  . Drug use: No    Allergies: No Known Allergies  Medications Prior to Admission  Medication Sig Dispense Refill Last Dose  . amoxicillin (AMOXIL) 500 MG capsule Take 1 capsule (500 mg total) by mouth 2 (two) times daily. 20 capsule 0   . ibuprofen (ADVIL,MOTRIN) 600 MG tablet Take 1 tablet (600 mg total) by mouth every 6 (six) hours as needed for pain. 30 tablet 1     Review of Systems  Constitutional: Negative.   Respiratory: Negative.   Cardiovascular: Negative.   Gastrointestinal: Positive for abdominal pain. Negative for constipation, diarrhea, nausea and vomiting.  Genitourinary: Positive for vaginal  bleeding. Negative for difficulty urinating, dysuria, frequency, hematuria, pelvic pain and urgency.  Musculoskeletal: Positive for back pain.  Neurological: Negative.    Physical Exam   Blood pressure 123/74, pulse 89, temperature 98.1 F (36.7 C), temperature source Oral, resp. rate 16, height 5\' 4"  (1.626 m), weight 74.2 kg, last menstrual period 02/11/2019, SpO2 100 %.  Physical Exam  Nursing note and vitals reviewed. Constitutional: She is oriented to person, place, and time. She appears well-developed and well-nourished. No distress.  Cardiovascular: Normal rate and regular rhythm.  Respiratory: Effort normal and breath sounds normal. No respiratory distress. She has no wheezes.  GI: Soft. She exhibits no distension. There is no abdominal tenderness. There is no rebound and no guarding.  Genitourinary:    Vaginal bleeding present.  There is bleeding in the vagina.    Genitourinary Comments: Pelvic exam: Cervix pink, visually closed, without lesion, moderate amount of dark red vaginal bleeding and mucous from cervical os, vaginal walls and external genitalia normal Bimanual exam: Cervix 0/long/high, firm, anterior, neg CMT, uterus nontender, nonenlarged, adnexa without tenderness, enlargement, or mass   Musculoskeletal: Normal range of motion.  General: No edema.  Neurological: She is alert and oriented to person, place, and time.  Psychiatric: She has a normal mood and affect. Her behavior is normal. Thought content normal.   MAU Course  Procedures  MDM Orders Placed This Encounter  Procedures  . Wet prep, genital  . US OB LESS THAN 14 WEEKS WITH OB TRANSVAGINAL  . Urinalysis, Routine w reflex microscopic  . CBC  . hCG, quantitative, pregnancy  . Pregnancy, urine POC   Meds ordered this encounter  Medications  . acetaminophen (TYLENOL) tablet 1,000 mg   Labs and US results pending Care [facility] over by [facility] Montez Stryker CNM @ [address]   Veronica C BourbonRogers, [place] IslandCNM 03/29/19,  8:08 PM    Assessment and Plan   Results for orders placed or performed during the hospital encounter of 03/29/19 (from the past 24 hour(s))  Pregnancy, urine POC     Status: Abnormal   Collection Time: 03/29/19  6:05 PM  Result Value Ref Range   Preg Test, Ur POSITIVE (A) NEGATIVE  Urinalysis, Routine w reflex microscopic     Status: Abnormal   Collection Time: 03/29/19  6:08 PM  Result Value Ref Range   Color, Urine YELLOW YELLOW   APPearance CLEAR CLEAR   Specific Gravity, Urine 1.014 1.005 - 1.030   pH 5.0 5.0 - 8.0   Glucose, UA NEGATIVE NEGATIVE mg/dL   Hgb urine dipstick LARGE (A) NEGATIVE   Bilirubin Urine NEGATIVE NEGATIVE   Ketones, ur 5 (A) NEGATIVE mg/dL   Protein, ur NEGATIVE NEGATIVE mg/dL   Nitrite NEGATIVE NEGATIVE   Leukocytes,Ua TRACE (A) NEGATIVE   RBC / HPF >50 (H) 0 - 5 RBC/hpf   WBC, UA 11-20 0 - 5 WBC/hpf   Bacteria, UA RARE (A) NONE SEEN   Squamous Epithelial / LPF 0-5 0 - 5   Mucus PRESENT   CBC     Status: None   Collection Time: 03/29/19  6:51 PM  Result Value Ref Range   WBC 7.5 4.0 - 10.5 K/uL   RBC 4.54 3.87 - 5.11 MIL/uL   Hemoglobin 13.4 12.0 - 15.0 g/dL   HCT 09.839.0 11.936.0 - 14.746.0 %   MCV 85.9 80.0 - 100.0 fL   MCH 29.5 26.0 - 34.0 pg   MCHC 34.4 30.0 - 36.0 g/dL   RDW 82.913.1 56.211.5 - 13.015.5 %   Platelets 244 150 - 400 K/uL   nRBC 0.0 0.0 - 0.2 %  hCG, quantitative, pregnancy     Status: Abnormal   Collection Time: 03/29/19  6:51 PM  Result Value Ref Range   hCG, Beta Chain, Quant, S 1,597 (H) <5 mIU/mL  Wet prep, genital     Status: Abnormal   Collection Time: 03/29/19  7:05 PM  Result Value Ref Range   Yeast Wet Prep HPF POC NONE SEEN NONE SEEN   Trich, Wet Prep NONE SEEN NONE SEEN   Clue Cells Wet Prep HPF POC NONE SEEN NONE SEEN   WBC, Wet Prep HPF POC MODERATE (A) NONE SEEN   Sperm NONE SEEN    [place] Ob Less Than 14 Weeks With Ob Transvaginal  Result Date: 03/29/2019 CLINICAL DATA:  Bleeding, cramping EXAM: OBSTETRIC <14 WK US AND  TRANSVAGINAL OB US TECHNIQUE: Both transabdominal and transvaginal ultrasound examinations were performed for complete evaluation of the gestation as well as the maternal uterus, adnexal regions, and pelvic cul-de-sac. Transvaginal technique was performed to assess early pregnancy. COMPARISON:  None. FINDINGS: LMP: 02/11/2019 GA by LMP:  6 w  4 d EDC by LMP: 11/18/2019 Intrauterine gestational sac: Single Yolk sac:  Not Visualized. Embryo:  Not Visualized. Cardiac Activity: Not Visualized. MSD: 3.1 mm   5 w   0 d Subchorionic hemorrhage:  None visualized. Maternal uterus/adnexae: Normal appearance of the uterus and maternal ovaries. Corpus luteum noted in the left ovary. No free fluid. IMPRESSION: Single intrauterine gestational sac is visualized. Nonvisualization of the fetal pole possibly to early to visualize given estimated gestational age of [redacted] weeks 0 days by mean sac diameter measurement. Recommend close clinical followup and serial quantitative beta HCGs and ultrasounds. Electronically Signed   By: Kreg Shropshire M.D.   On: 03/29/2019 20:03    Reassessment (8:59 PM)  -Results discussed with patient. -Informed of need for follow up quant to further evaluate pregnancy. -Patient expresses confusion and disappointment with "not having answers today." -Informed that close follow up would be necessary to evaluate viability of pregnancy. -Patient informed of need for repeat quant on Tuesday and states she has appt with [facility].  Patient states she would like to have repeat labs completed at that time. -Patient questions and concerns addressed. -Reiterated need for close follow up.  -Bleeding Precautions Given -Encouraged to call or return to MAU if symptoms worsen or with the onset of new symptoms. -Discharged to home in stable condition.   Cherre Robins MSN, CNM Advanced Practice Provider, [facility] [employer]  (9:13 PM)  -Voicemail left with "Cam" at office informing of  patient visit, need for follow up, and note availability.

## 2019-03-29 NOTE — MAU Note (Signed)
Ann Everett is a 38 y.o. at [redacted]w[redacted]d here in MAU reporting: started last Monday, it was pink and light. Yesterday and today it has been heavier and more red. Is not wearing a pad, sees the bleeding when she wipes and today has seen some in her underwear. Also having some cramping and back pain.  Onset of complaint: ongoing but worse since yesterday  Pain score: abdominal pain 4/10, back pain 7/10  Vitals:   03/29/19 1811  BP: 123/74  Pulse: 89  Resp: 16  Temp: 98.1 F (36.7 C)  SpO2: 100%      Lab orders placed from triage: UA, UPT

## 2019-03-29 NOTE — Discharge Instructions (Signed)
Vaginal Bleeding During Pregnancy, First Trimester ° °A small amount of bleeding (spotting) from the vagina is common during early pregnancy. Sometimes the bleeding is normal and does not cause problems. At other times, though, bleeding may be a sign of something serious. Tell your doctor about any bleeding from your vagina right away. °Follow these instructions at home: °Activity °· Follow your doctor's instructions about how active you can be. °· If needed, make plans for someone to help with your normal activities. °· Do not have sex or orgasms until your doctor says that this is safe. °General instructions °· Take over-the-counter and prescription medicines only as told by your doctor. °· Watch your condition for any changes. °· Write down: °? The number of pads you use each day. °? How often you change pads. °? How soaked (saturated) your pads are. °· Do not use tampons. °· Do not douche. °· If you pass any tissue from your vagina, save it to show to your doctor. °· Keep all follow-up visits as told by your doctor. This is important. °Contact a doctor if: °· You have vaginal bleeding at any time while you are pregnant. °· You have cramps. °· You have a fever. °Get help right away if: °· You have very bad cramps in your back or belly (abdomen). °· You pass large clots or a lot of tissue from your vagina. °· Your bleeding gets worse. °· You feel light-headed. °· You feel weak. °· You pass out (faint). °· You have chills. °· You are leaking fluid from your vagina. °· You have a gush of fluid from your vagina. °Summary °· Sometimes vaginal bleeding during pregnancy is normal and does not cause problems. At other times, bleeding may be a sign of something serious. °· Tell your doctor about any bleeding from your vagina right away. °· Follow your doctor's instructions about how active you can be. You may need someone to help you with your normal activities. °This information is not intended to replace advice given to  you by your health care provider. Make sure you discuss any questions you have with your health care provider. °Document Released: 11/03/2013 Document Revised: 10/08/2018 Document Reviewed: 09/20/2016 °Elsevier Patient Education © 2020 [employer] ° °

## 2019-04-01 LAB — CERVICOVAGINAL ANCILLARY ONLY
Chlamydia: NEGATIVE
Neisseria Gonorrhea: NEGATIVE

## 2020-11-29 ENCOUNTER — Other Ambulatory Visit: Payer: Self-pay

## 2020-11-29 ENCOUNTER — Emergency Department (HOSPITAL_BASED_OUTPATIENT_CLINIC_OR_DEPARTMENT_OTHER)
Admission: EM | Admit: 2020-11-29 | Discharge: 2020-11-29 | Disposition: A | Discharge status: Home / Self Care | Payer: PRIVATE HEALTH INSURANCE | Attending: Emergency Medicine | Admitting: Emergency Medicine

## 2020-11-29 ENCOUNTER — Encounter (HOSPITAL_BASED_OUTPATIENT_CLINIC_OR_DEPARTMENT_OTHER): Payer: Self-pay | Admitting: *Deleted

## 2020-11-29 DIAGNOSIS — M25561 Pain in right knee: Secondary | ICD-10-CM | POA: Diagnosis not present

## 2020-11-29 DIAGNOSIS — M25562 Pain in left knee: Secondary | ICD-10-CM | POA: Insufficient documentation

## 2020-11-29 NOTE — Discharge Instructions (Addendum)
I recommend a combination of tylenol and ibuprofen for management of your pain. You can take a low dose of both at the same time. I recommend 500 mg of Tylenol combined with 600 mg of ibuprofen. This is one maximum strength Tylenol and three regular ibuprofen. You can take these 2-3 times for day for your pain. Please try to take these medications with a small amount of food as well to prevent upsetting your stomach.  Also, please consider topical pain relieving creams such as Voltaran Gel, BioFreeze, or Icy Hot. There is also a pain relieving cream made by [employer]. You should be able to find all of these at your local pharmacy.   Please continue to ice the knees as well as elevate them in the evening.  I would work on trying to stay off your feet for the next few days until your symptoms subside.  Below is the contact information for Dr. Jordan Likes.  He is a local sports medicine doctor that is located in the same building as this emergency department.  If you feel that your symptoms have not improved in the next 1 to 2 weeks I would recommend following up with him in scheduling an appointment for reevaluation.  You can always return to the emergency department if your symptoms worsen.  It was a pleasure to meet you.

## 2020-11-29 NOTE — ED Triage Notes (Signed)
Reports bilateral knee pain since beginning to run 2 weeks ago. Right knee worse than left

## 2020-11-29 NOTE — ED Provider Notes (Signed)
[facility] EMERGENCY DEPARTMENT Provider Note   CSN: [id number] Arrival date & time: 11/29/20  1520     History Chief Complaint  Patient presents with  . Knee Pain    Ann Everett is a 40 y.o. female.  HPI Patient is a 40 year old female with a medical history as noted below.  She presents to the emergency department due to bilateral knee pain.  Patient states she started a new running/walking regimen about 2 weeks ago.  She ran/walked every day for 1 week.  She was having mild to moderate pain at the time.  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  Her pain persisted and over the past few days began to worsen.  She states that she now has moderate, constant, bilateral, right greater than left knee pain that worsens with bearing weight as well as ambulating.  No numbness or weakness.  No fevers, chills, nausea, vomiting.  Denies any history of blood clots, unilateral leg swelling, oral birth control use, recent travel, hemoptysis, chest pain, or shortness of breath.    Past Medical History:  Diagnosis Date  . Infection    UTI    Patient Active Problem List   Diagnosis Date Noted  . Susceptible varicella 07/24/2012    Past Surgical History:  Procedure Laterality Date  . WISDOM TOOTH EXTRACTION  2013     OB History    Gravida  3   Para  2   Term  2   Preterm      AB      Living  2     SAB      IAB      Ectopic      Multiple      Live Births  2           Family History  Problem Relation Age of Onset  . Dementia Father     Social History   Tobacco Use  . Smoking status: Never Smoker  . Smokeless tobacco: Never Used  Substance Use Topics  . Alcohol use: No  . Drug use: No    Home Medications Prior to Admission medications   Not on File    Allergies    Patient has no known allergies.  Review of Systems   Review of Systems  Respiratory: Negative for shortness of breath.    Cardiovascular: Negative for chest pain and leg swelling.  Gastrointestinal: Negative for nausea and vomiting.  Musculoskeletal: Positive for arthralgias.  Skin: Negative for color change and wound.  Neurological: Negative for weakness and numbness.   Physical Exam Updated Vital Signs BP 125/79 (BP Location: Left Arm)   Pulse (!) 58   Temp 98.6 F (37 C) (Oral)   Resp 20   Ht 5\' 4"  (1.626 m)   Wt 70.3 kg   LMP 11/09/2020   SpO2 100%   BMI 26.61 kg/m   Physical Exam Vitals and nursing note reviewed.  Constitutional:      General: She is not in acute distress.    Appearance: She is well-developed.  HENT:     Head: Normocephalic and atraumatic.     Right Ear: External ear normal.     Left Ear: External ear normal.  Eyes:     General: No scleral icterus.       Right eye: No discharge.        Left eye: No discharge.     Conjunctiva/sclera: Conjunctivae normal.  Neck:     Trachea: No tracheal deviation.  Cardiovascular:     Rate and Rhythm: Normal rate.  Pulmonary:     Effort: Pulmonary effort is normal. No respiratory distress.     Breath sounds: No stridor.  Abdominal:     General: There is no distension.  Musculoskeletal:        General: Tenderness present. No swelling or deformity.     Cervical back: Neck supple.     Comments: Right leg: Moderate TTP noted along the patellar tendon as well as the medial epicondyle of the knee.  No joint effusion noted.  No overlying skin changes.  No increased warmth.  No cellulitic changes.  2+ DP pulses.  Distal sensation intact.  Full active and passive range of motion in the knee.  Left leg: Mild TTP noted along the medial epicondyle of the left knee.  No joint effusion noted.  No overlying skin changes.  No increased warmth.  No cellulitic changes.  Full active and passive range of motion in the knee.  2+ DP pulses.  Distal sensation intact.  Skin:    General: Skin is warm and dry.     Findings: No rash.  Neurological:      Mental Status: She is alert.     Cranial Nerves: Cranial nerve deficit: no gross deficits.    ED Results / Procedures / Treatments   Labs (all labs ordered are listed, but only abnormal results are displayed) Labs Reviewed - No data to display  EKG None  Radiology No results found.  Procedures Procedures   Medications Ordered in ED Medications - No data to display  ED Course  I have reviewed the triage vital signs and the nursing notes.  Pertinent labs & imaging results that were available during my care of the patient were reviewed by me and considered in my medical decision making (see chart for details).    MDM Rules/Calculators/A&P                          Patient is a 40 year old female who presents to the emergency department due to bilateral knee pain that began after starting a new running regimen 2 weeks ago. No known injury.   Physical exam is extremely reassuring.  She has mild to moderate bony tenderness in both knees.  Appears to be worse on the right.  Based on her history and physical exam this appears to be an overuse injury.  Possibly developing patellar tendinitis in the right knee.  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  She denies any systemic symptoms such as fevers, chills, nausea, or vomiting.  No increased warmth in the joint or overlying skin changes.  Doubt septic joint.  No risk factors for DVT/PE.  No recent travel, hemoptysis, cough, chest pain, shortness of breath, unilateral leg swelling, or birth control use, or history of blood clots.  Feel the patient is stable for discharge at this time and she is agreeable.  Recommended continued use of Tylenol and ibuprofen.  She was given knee sleeves for both knees in the emergency department.  We discussed dosing.  Also recommended multiple topical OTC analgesics for management of her pain.  The RICE method.  She was given a referral to a  local sports medicine physician if she finds that her symptoms are refractory.  Discussed return precautions.  Her questions were answered  and she was amicable at the time of discharge.  Final Clinical Impression(s) / ED Diagnoses Final diagnoses:  Acute pain of both knees   Rx / DC Orders ED Discharge Orders    None       Placido Sou, PA-C 11/29/20 1637    Terald Sleeper, MD 11/30/20 [phone]

## 2020-12-15 ENCOUNTER — Ambulatory Visit: Payer: Self-pay

## 2020-12-15 ENCOUNTER — Ambulatory Visit (INDEPENDENT_AMBULATORY_CARE_PROVIDER_SITE_OTHER): Payer: PRIVATE HEALTH INSURANCE | Admitting: Family Medicine

## 2020-12-15 ENCOUNTER — Other Ambulatory Visit: Payer: Self-pay

## 2020-12-15 ENCOUNTER — Encounter: Payer: Self-pay | Admitting: Family Medicine

## 2020-12-15 VITALS — BP 122/82 | Ht 64.0 in | Wt 155.0 lb

## 2020-12-15 DIAGNOSIS — M25561 Pain in right knee: Secondary | ICD-10-CM

## 2020-12-15 DIAGNOSIS — M7050 Other bursitis of knee, unspecified knee: Secondary | ICD-10-CM | POA: Diagnosis not present

## 2020-12-15 MED ORDER — PREDNISONE 5 MG PO TABS: ORAL_TABLET | ORAL | 0 refills | Status: DC

## 2020-12-15 NOTE — Progress Notes (Signed)
  Ann Everett - 40 y.o. female MRN [id number]  Date of birth: May 20, 1981  SUBJECTIVE:  Including CC & ROS.  No chief complaint on file.   Ann Everett is a 40 y.o. female that is presenting with bilateral knee pain.  The pain occurred after she started a running regimen.  The right knee is worse than the left.  The pain is medial in nature.  No history of similar pain.   Review of Systems See HPI   HISTORY: Past Medical, Surgical, Social, and Family History Reviewed & Updated per EMR.   Pertinent Historical Findings include:  Past Medical History:  Diagnosis Date   Infection    UTI    Past Surgical History:  Procedure Laterality Date   WISDOM TOOTH EXTRACTION  2013    Family History  Problem Relation Age of Onset   Dementia Father     Social History   Socioeconomic History   Marital status: Married    Spouse name: TILLAL   Number of children: Not on file   Years of education: 16   Highest education level: Not on file  Occupational History   Occupation: MEDICAL LAB TECH  Tobacco Use   Smoking status: Never   Smokeless tobacco: Never  Substance and Sexual Activity   Alcohol use: No   Drug use: No   Sexual activity: Yes    Partners: Male  Other Topics Concern   Not on file  Social History Narrative   Not on file   Social Determinants of Health   Financial Resource Strain: Not on file  Food Insecurity: Not on file  Transportation Needs: Not on file  Physical Activity: Not on file  Stress: Not on file  Social Connections: Not on file  Intimate Partner Violence: Not on file     PHYSICAL EXAM:  VS: BP 122/82 (BP Location: Left Arm, Patient Position: Sitting, Cuff Size: Normal)   Ht 5\' 4"  (1.626 m)   Wt 155 lb (70.3 kg)   BMI 26.61 kg/m  Physical Exam Gen: NAD, alert, cooperative with exam, well-appearing MSK:  Right and left knee: Normal range of motion. Normal strength resistance. Some tenderness to palpation of the Pez  anserine. Neurovascularly intact  Limited ultrasound: Right knee, left knee:  Right knee: No effusion of the suprapatellar pouch. Normal-appearing quadricep and patellar tendon. Mild medial joint space narrowing. Anechoic change at the Pes anserine to demonstrate a bursitis with increased hyperemia over the medial portion of the proximal tibial plateau  Left knee: No effusion in the suprapatellar pouch. Normal-appearing quadricep patellar tendon. Normal-appearing medial joint space and lateral joint space.  Summary: Findings consistent with pes anserine bursitis   Ultrasound and interpretation by , MD    ASSESSMENT & PLAN:   Pes anserine bursitis Symptoms seem more consistent with bursitis associated with increase in her running regimen.  Seems less likely for a stress fracture.  Possible for stress reaction given the increased hyperemia of the tibial plateau.  No changes of meniscus. -Counseled on home exercise therapy and supportive care. -Counseled on return to training and running. -Prednisone. -Could consider further imaging and/or physical therapy.

## 2020-12-15 NOTE — Patient Instructions (Signed)
Nice to meet you Please try ice as needed  Please try the exercises  Please try the prednisone   Please send me a message in MyChart with any questions or updates.  Please see me back in 4 weeks.   --Dr. Jordan Likes

## 2020-12-16 DIAGNOSIS — M7050 Other bursitis of knee, unspecified knee: Secondary | ICD-10-CM | POA: Insufficient documentation

## 2020-12-16 NOTE — Assessment & Plan Note (Signed)
Symptoms seem more consistent with bursitis associated with increase in her running regimen.  Seems less likely for a stress fracture.  Possible for stress reaction given the increased hyperemia of the tibial plateau.  No changes of meniscus. -Counseled on home exercise therapy and supportive care. -Counseled on return to training and running. -Prednisone. -Could consider further imaging and/or physical therapy.

## 2021-01-18 ENCOUNTER — Ambulatory Visit: Payer: PRIVATE HEALTH INSURANCE | Admitting: Family Medicine

## 2021-03-23 ENCOUNTER — Other Ambulatory Visit: Payer: Self-pay

## 2021-03-23 ENCOUNTER — Ambulatory Visit (INDEPENDENT_AMBULATORY_CARE_PROVIDER_SITE_OTHER): Payer: PRIVATE HEALTH INSURANCE | Admitting: Medical

## 2021-03-23 VITALS — BP 113/63 | HR 70 | Temp 98.4°F | Resp 18 | Ht 64.0 in | Wt 162.8 lb

## 2021-03-23 DIAGNOSIS — M791 Myalgia, unspecified site: Secondary | ICD-10-CM

## 2021-03-23 DIAGNOSIS — Z0000 Encounter for general adult medical examination without abnormal findings: Secondary | ICD-10-CM | POA: Diagnosis not present

## 2021-03-23 DIAGNOSIS — Z124 Encounter for screening for malignant neoplasm of cervix: Secondary | ICD-10-CM

## 2021-03-23 DIAGNOSIS — R5383 Other fatigue: Secondary | ICD-10-CM

## 2021-03-23 DIAGNOSIS — E559 Vitamin D deficiency, unspecified: Secondary | ICD-10-CM

## 2021-03-23 DIAGNOSIS — L709 Acne, unspecified: Secondary | ICD-10-CM

## 2021-03-23 DIAGNOSIS — M2550 Pain in unspecified joint: Secondary | ICD-10-CM

## 2021-03-23 NOTE — Patient Instructions (Addendum)
For you wellness exam today I have ordered cbc, cmp and lipid panel.  Vaccine up to date. Flu thru work.  Recommend exercise and healthy diet.  We will let you know lab results as they come in.  Follow up date appointment will be determined after lab review.    For myalgia and joint pain will get inflammatory labs.  For fatigue including b12, b1, tsh and t4.  For vit D deficiency will get   Follow up date to be determined after lab review.  Referral to gyn for pap.  Hx of acne. Will refer to derm.      Preventive Care 41-83 Years Old, Female Preventive care refers to lifestyle choices and visits with your health care provider that can promote health and wellness. This includes: A yearly physical exam. This is also called an annual wellness visit. Regular dental and eye exams. Immunizations. Screening for certain conditions. Healthy lifestyle choices, such as: Eating a healthy diet. Getting regular exercise. Not using drugs or products that contain nicotine and tobacco. Limiting alcohol use. What can I expect for my preventive care visit? Physical exam Your health care provider may check your: Height and weight. These may be used to calculate your BMI (body mass index). BMI is a measurement that tells if you are at a healthy weight. Heart rate and blood pressure. Body temperature. Skin for abnormal spots. Counseling Your health care provider may ask you questions about your: Past medical problems. Family's medical history. Alcohol, tobacco, and drug use. Emotional well-being. Home life and relationship well-being. Sexual activity. Diet, exercise, and sleep habits. Work and work Statistician. Access to firearms. Method of birth control. Menstrual cycle. Pregnancy history. What immunizations do I need? Vaccines are usually given at various ages, according to a schedule. Your health care provider will recommend vaccines for you based on your age, medical history,  and lifestyle or other factors, such as travel or where you work. What tests do I need? Blood tests Lipid and cholesterol levels. These may be checked every 5 years starting at age 69. Hepatitis C test. Hepatitis B test. Screening Diabetes screening. This is done by checking your blood sugar (glucose) after you have not eaten for a while (fasting). STD (sexually transmitted disease) testing, if you are at risk. BRCA-related cancer screening. This may be done if you have a family history of breast, ovarian, tubal, or peritoneal cancers. Pelvic exam and Pap test. This may be done every 3 years starting at age 59. Starting at age 70, this may be done every 5 years if you have a Pap test in combination with an HPV test. Talk with your health care provider about your test results, treatment options, and if necessary, the need for more tests. Follow these instructions at home: Eating and drinking  Eat a healthy diet that includes fresh fruits and vegetables, whole grains, lean protein, and low-fat dairy products. Take vitamin and mineral supplements as recommended by your health care provider. Do not drink alcohol if: Your health care provider tells you not to drink. You are pregnant, may be pregnant, or are planning to become pregnant. If you drink alcohol: Limit how much you have to 0-1 drink a day. Be aware of how much alcohol is in your drink. In the [place] one drink equals one 12 oz bottle of beer (355 mL), one 5 oz glass of wine (148 mL), or one 1 oz glass of hard liquor (44 mL). Lifestyle Take daily care of your teeth and  gums. Brush your teeth every morning and night with fluoride toothpaste. Floss one time each day. Stay active. Exercise for at least 30 minutes 5 or more days each week. Do not use any products that contain nicotine or tobacco, such as cigarettes, e-cigarettes, and chewing tobacco. If you need help quitting, ask your health care provider. Do not use drugs. If you are  sexually active, practice safe sex. Use a condom or other form of protection to prevent STIs (sexually transmitted infections). If you do not wish to become pregnant, use a form of birth control. If you plan to become pregnant, see your health care provider for a prepregnancy visit. Find healthy ways to cope with stress, such as: Meditation, yoga, or listening to music. Journaling. Talking to a trusted person. Spending time with friends and family. Safety Always wear your seat belt while driving or riding in a vehicle. Do not drive: If you have been drinking alcohol. Do not ride with someone who has been drinking. When you are tired or distracted. While texting. Wear a helmet and other protective equipment during sports activities. If you have firearms in your house, make sure you follow all gun safety procedures. Seek help if you have been physically or sexually abused. What's next? Go to your health care provider once a year for an annual wellness visit. Ask your health care provider how often you should have your eyes and teeth checked. Stay up to date on all vaccines. This information is not intended to replace advice given to you by your health care provider. Make sure you discuss any questions you have with your health care provider. Document Revised: 08/27/2020 Document Reviewed: 02/28/2018 Elsevier Patient Education  2022 [employer].

## 2021-03-23 NOTE — Progress Notes (Signed)
Subjective:    Patient ID: Ann Everett, female    DOB: 31-Oct-1980, 40 y.o.   MRN: [id number]  HPI  Pt in for first time.  Pt aware needs to start seeing provider regular as now reaching 40 yo.  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 Pt exercisng. Started 2 month ago. Got knee pain  logging so stopped. Then restarted exercising for one month but only walking. If she tries jog will have mild pain.  Pt states moderate healthy diet.  Nonsmoker and no alcohol.  Married- 2 children 3 yo and 57 yo.  Pt will get flu vaccine at work.   Tdap -7 years ago possible.  Pt due for pap. She has gyn. Miscarriage. Years since last pap.   Hx of vit D deficiency.  Hx of reflux 2 years ago. Uses tums occasionally.  Muscle heaviness at night when wakes up.   Occasional knee pains.  Review of Systems  Constitutional:  Positive for fatigue. Negative for chills and fever.  Eyes:  Negative for photophobia and redness.  Respiratory:  Negative for cough, chest tightness, shortness of breath and wheezing.   Cardiovascular:  Negative for chest pain and palpitations.  Gastrointestinal:  Negative for abdominal pain, constipation, diarrhea and nausea.  Genitourinary:  Negative for dysuria, frequency, hematuria and urgency.  Musculoskeletal:  Positive for myalgias. Negative for back pain.  Skin:  Negative for rash.  Neurological:  Negative for dizziness, seizures, syncope, numbness and headaches.  Hematological:  Negative for adenopathy.  Psychiatric/Behavioral:  Negative for behavioral problems, decreased concentration and dysphoric mood.     Past Medical History:  Diagnosis Date   Infection    UTI     Social History   Socioeconomic History   Marital status: Married    Spouse name: TILLAL   Number of children: Not on file   Years of education: 16   Highest education level: Not on file  Occupational History   Occupation: MEDICAL LAB TECH  Tobacco Use   Smoking status: Never    Smokeless tobacco: Never  Substance and Sexual Activity   Alcohol use: No   Drug use: No   Sexual activity: Yes    Partners: Male  Other Topics Concern   Not on file  Social History Narrative   Not on file   Social Determinants of Health   Financial Resource Strain: Not on file  Food Insecurity: Not on file  Transportation Needs: Not on file  Physical Activity: Not on file  Stress: Not on file  Social Connections: Not on file  Intimate Partner Violence: Not on file    Past Surgical History:  Procedure Laterality Date   WISDOM TOOTH EXTRACTION  2013    Family History  Problem Relation Age of Onset   Dementia Father     No Known Allergies  No current outpatient medications on file prior to visit.   No current facility-administered medications on file prior to visit.    BP 113/63   Pulse 70   Temp 98.4 F (36.9 C)   Resp 18   Ht 5\' 4"  (1.626 m)   Wt 162 lb 12.8 oz (73.8 kg)   SpO2 100%   BMI 27.94 kg/m       Objective:   Physical Exam   General Mental Status- Alert. General Appearance- Not in acute distress.   Skin General: Color- Normal Color. Moisture- Normal Moisture.  Neck Carotid Arteries- Normal color. Moisture- Normal Moisture. No carotid bruits.  No JVD.  Chest and Lung Exam Auscultation: Breath Sounds:-Normal.  Cardiovascular Auscultation:Rythm- Regular. Murmurs & Other Heart Sounds:Auscultation of the heart reveals- No Murmurs.  Abdomen Inspection:-Inspeection Normal. Palpation/Percussion:Note:No mass. Palpation and Percussion of the abdomen reveal- Non Tender, Non Distended + BS, no rebound or guarding.   Neurologic Cranial Nerve exam:- CN III-XII intact(No nystagmus), symmetric smile. Strength:- 5/5 equal and symmetric strength both upper and lower extremities.   Skin- some acnes present on exam of face.    Assessment & Plan:  For you wellness exam today I have ordered cbc, cmp and lipid panel.  Vaccine up to date. Flu thru  work.  Recommend exercise and healthy diet.  We will let you know lab results as they come in.  Follow up date appointment will be determined after lab review.    For myalgia and joint pain will get inflammatory labs.  For fatigue including b12, b1, tsh and t4.  For vit D deficiency will get   Follow up date to be determined after lab review.   Esperanza Richters, PA-C

## 2021-03-24 ENCOUNTER — Other Ambulatory Visit (HOSPITAL_COMMUNITY)
Admission: RE | Admit: 2021-03-24 | Discharge: 2021-03-24 | Disposition: A | Discharge status: Home / Self Care | Payer: PRIVATE HEALTH INSURANCE | Source: Ambulatory Visit

## 2021-03-25 ENCOUNTER — Other Ambulatory Visit (HOSPITAL_COMMUNITY)
Admission: RE | Admit: 2021-03-25 | Discharge: 2021-03-25 | Disposition: A | Discharge status: Home / Self Care | Payer: PRIVATE HEALTH INSURANCE | Source: Ambulatory Visit | Attending: Medical | Admitting: Medical

## 2021-03-25 DIAGNOSIS — E559 Vitamin D deficiency, unspecified: Secondary | ICD-10-CM | POA: Diagnosis not present

## 2021-03-25 DIAGNOSIS — M2550 Pain in unspecified joint: Secondary | ICD-10-CM | POA: Diagnosis not present

## 2021-03-25 DIAGNOSIS — Z0000 Encounter for general adult medical examination without abnormal findings: Secondary | ICD-10-CM | POA: Diagnosis not present

## 2021-03-25 DIAGNOSIS — R5383 Other fatigue: Secondary | ICD-10-CM | POA: Diagnosis present

## 2021-03-25 LAB — CBC WITH DIFFERENTIAL/PLATELET
Abs Immature Granulocytes: 0.01 10*3/uL (ref 0.00–0.07)
Basophils Absolute: 0 10*3/uL (ref 0.0–0.1)
Basophils Relative: 1 %
Eosinophils Absolute: 0.1 10*3/uL (ref 0.0–0.5)
Eosinophils Relative: 1 %
HCT: 40 % (ref 36.0–46.0)
Hemoglobin: 12.8 g/dL (ref 12.0–15.0)
Immature Granulocytes: 0 %
Lymphocytes Relative: 51 %
Lymphs Abs: 1.8 10*3/uL (ref 0.7–4.0)
MCH: 26.8 pg (ref 26.0–34.0)
MCHC: 32 g/dL (ref 30.0–36.0)
MCV: 83.7 fL (ref 80.0–100.0)
Monocytes Absolute: 0.3 10*3/uL (ref 0.1–1.0)
Monocytes Relative: 9 %
Neutro Abs: 1.3 10*3/uL — ABNORMAL LOW (ref 1.7–7.7)
Neutrophils Relative %: 38 %
Platelets: 262 10*3/uL (ref 150–400)
RBC: 4.78 MIL/uL (ref 3.87–5.11)
RDW: 12.9 % (ref 11.5–15.5)
WBC: 3.6 10*3/uL — ABNORMAL LOW (ref 4.0–10.5)
nRBC: 0 % (ref 0.0–0.2)

## 2021-03-25 LAB — COMPREHENSIVE METABOLIC PANEL
ALT: 25 U/L (ref 0–44)
AST: 21 U/L (ref 15–41)
Albumin: 3.6 g/dL (ref 3.5–5.0)
Alkaline Phosphatase: 66 U/L (ref 38–126)
Anion gap: 7 (ref 5–15)
BUN: 9 mg/dL (ref 6–20)
CO2: 25 mmol/L (ref 22–32)
Calcium: 9.3 mg/dL (ref 8.9–10.3)
Chloride: 104 mmol/L (ref 98–111)
Creatinine, Ser: 0.71 mg/dL (ref 0.44–1.00)
GFR, Estimated: >60 mL/min (ref >60)
Glucose, Bld: 91 mg/dL (ref 70–99)
Potassium: 3.7 mmol/L (ref 3.5–5.1)
Sodium: 136 mmol/L (ref 135–145)
Total Bilirubin: 0.5 mg/dL (ref 0.3–1.2)
Total Protein: 7.4 g/dL (ref 6.5–8.1)

## 2021-03-25 LAB — T4, FREE: Free T4: 1.02 ng/dL (ref 0.61–1.12)

## 2021-03-25 LAB — LIPID PANEL
Cholesterol: 227 mg/dL — ABNORMAL HIGH (ref 0–200)
HDL: 86 mg/dL (ref >40)
LDL Cholesterol: 129 mg/dL — ABNORMAL HIGH (ref 0–99)
Total CHOL/HDL Ratio: 2.6 RATIO
Triglycerides: 58 mg/dL (ref <150)
VLDL: 12 mg/dL (ref 0–40)

## 2021-03-25 LAB — TSH: TSH: 0.583 u[IU]/mL (ref 0.350–4.500)

## 2021-03-25 LAB — SEDIMENTATION RATE: Sed Rate: 18 mm/hr (ref 0–22)

## 2021-03-25 LAB — VITAMIN B12: Vitamin B-12: 235 pg/mL (ref 180–914)

## 2021-03-25 LAB — C-REACTIVE PROTEIN: CRP: 1 mg/dL — ABNORMAL HIGH (ref <1.0)

## 2021-03-26 LAB — ANA COMPREHENSIVE PANEL
Anti JO-1: <0.2 AI (ref 0.0–0.9)
Centromere Ab Screen: <0.2 AI (ref 0.0–0.9)
Chromatin Ab SerPl-aCnc: <0.2 AI (ref 0.0–0.9)
ENA SM Ab Ser-aCnc: <0.2 AI (ref 0.0–0.9)
Ribonucleic Protein: 0.8 AI (ref 0.0–0.9)
SSA (Ro) (ENA) Antibody, IgG: <0.2 AI (ref 0.0–0.9)
SSB (La) (ENA) Antibody, IgG: <0.2 AI (ref 0.0–0.9)
Scleroderma (Scl-70) (ENA) Antibody, IgG: <0.2 AI (ref 0.0–0.9)
ds DNA Ab: 1 IU/mL (ref 0–9)

## 2021-03-26 LAB — RHEUMATOID FACTOR: Rheumatoid fact SerPl-aCnc: <10 IU/mL (ref <14.0)

## 2021-03-29 ENCOUNTER — Encounter: Payer: Self-pay | Admitting: Medical

## 2021-04-04 LAB — VITAMIN D 1,25 DIHYDROXY
Vitamin D 1, 25 (OH)2 Total: 51 pg/mL
Vitamin D2 1, 25 (OH)2: 18 pg/mL
Vitamin D3 1, 25 (OH)2: 33 pg/mL

## 2021-05-17 IMAGING — US US OB < 14 WEEKS - US OB TV
1 series · 15 of 28 positions shown · non-contrast
Comparison: None.

CLINICAL DATA: Bleeding, cramping

EXAM:
OBSTETRIC <14 WK US AND TRANSVAGINAL OB US
TECHNIQUE: Both transabdominal and transvaginal ultrasound examinations were
performed for complete evaluation of the gestation as well as the
maternal uterus, adnexal regions, and pelvic cul-de-sac.
Transvaginal technique was performed to assess early pregnancy.

[Series 1: us ob < 14 weeks - us ob tv · 15 of 36 slices shown]
[im 1/36]
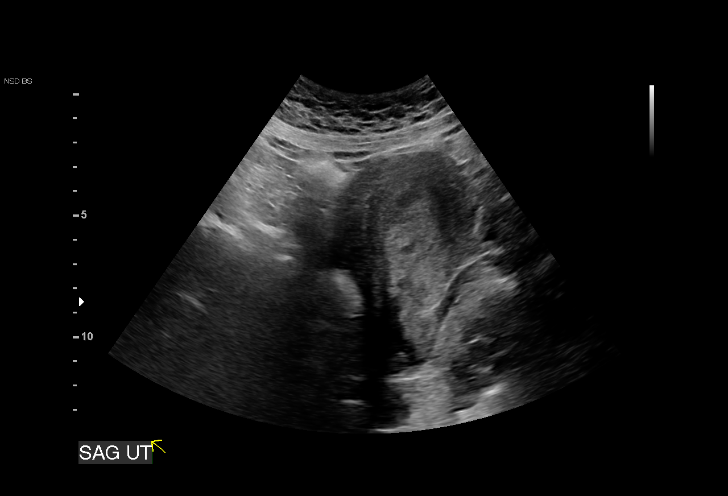
[im 3/36]
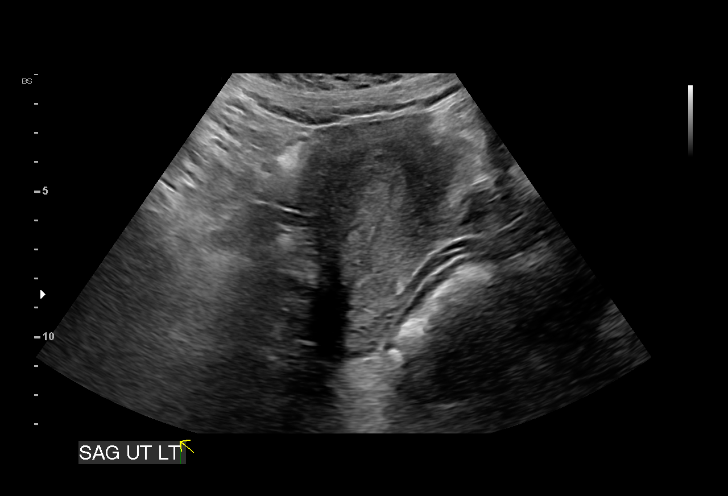
[im 6/36]
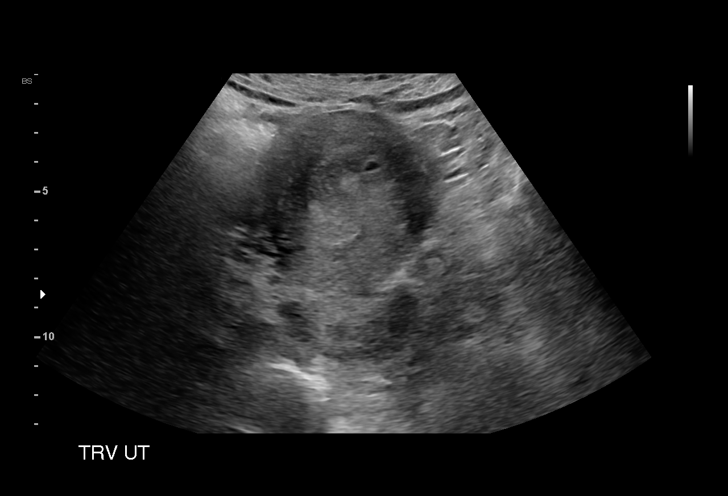
[im 8/36]
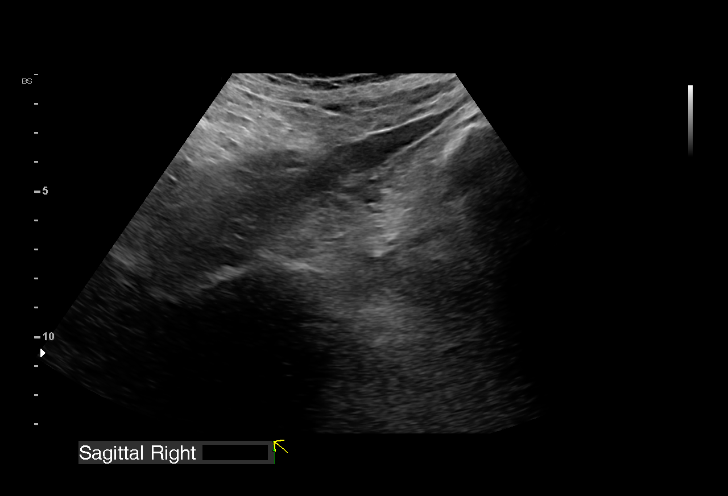
[im 11/36]
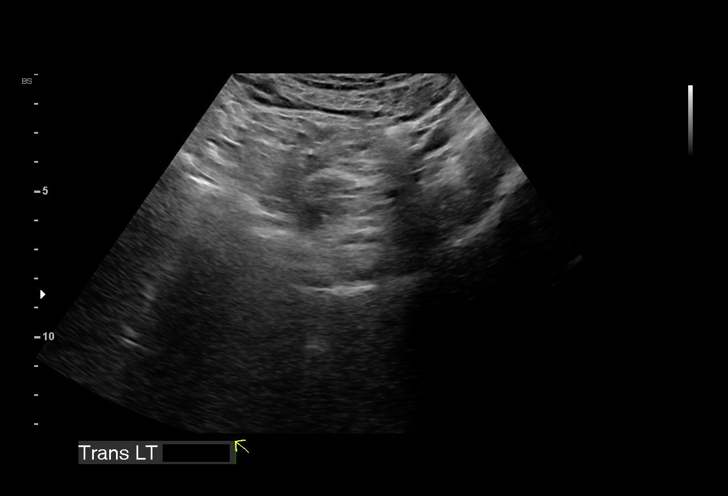
[im 13/36]
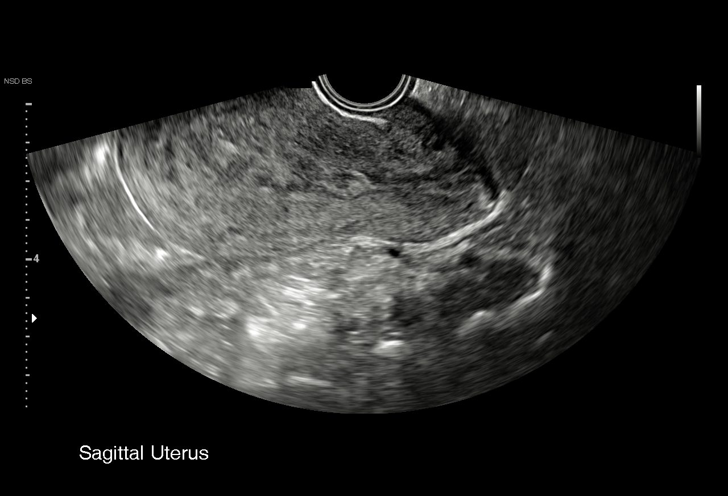
[im 16/36]
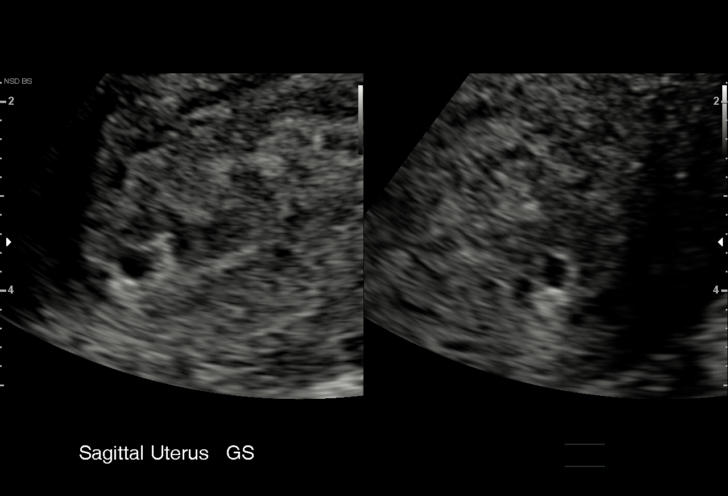
[im 19/36]
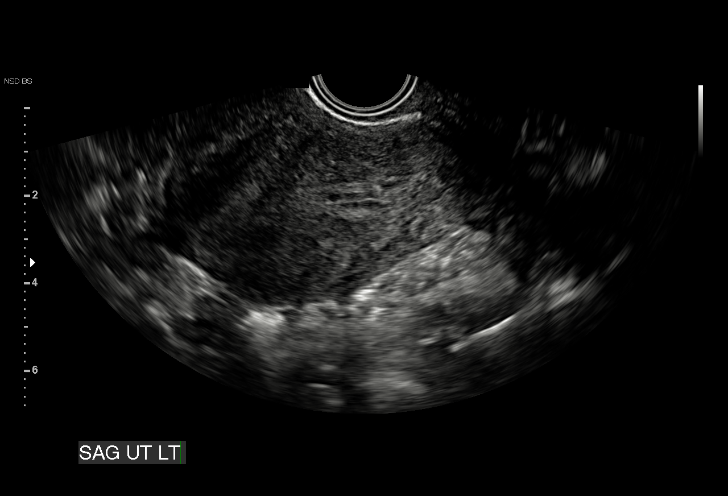
[im 20/36]
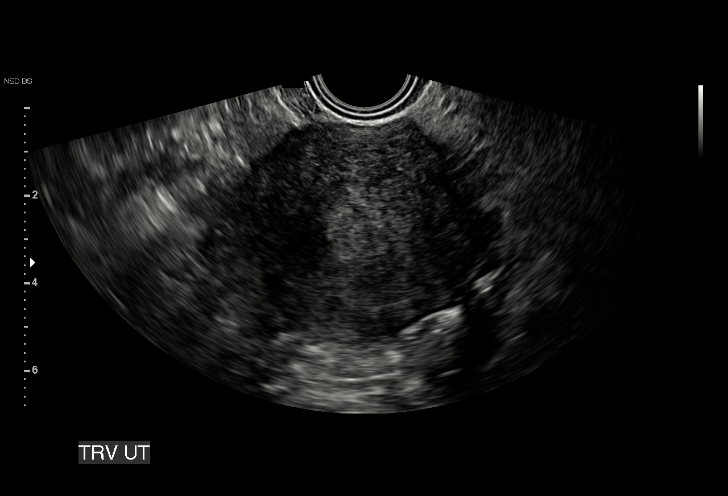
[im 23/36]
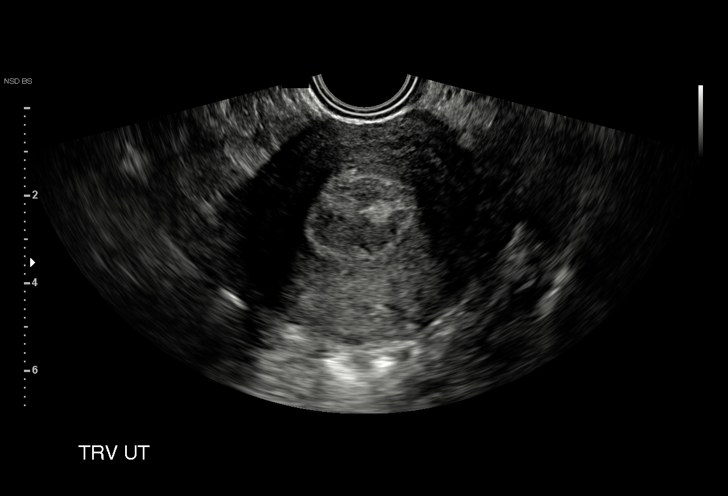
[im 25/36]
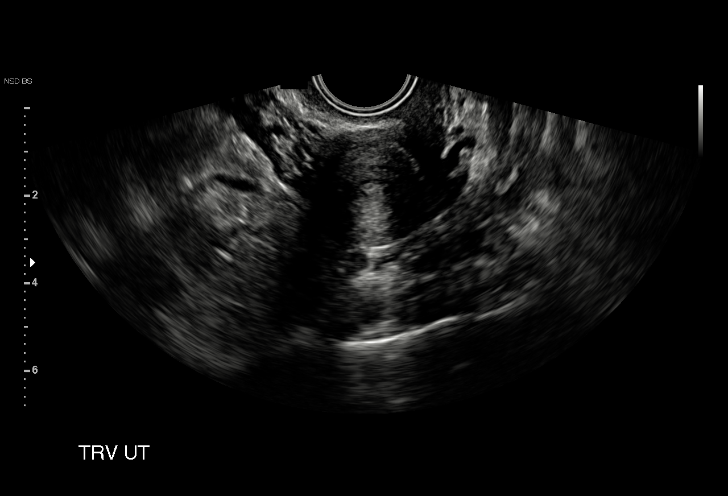
[im 28/36]
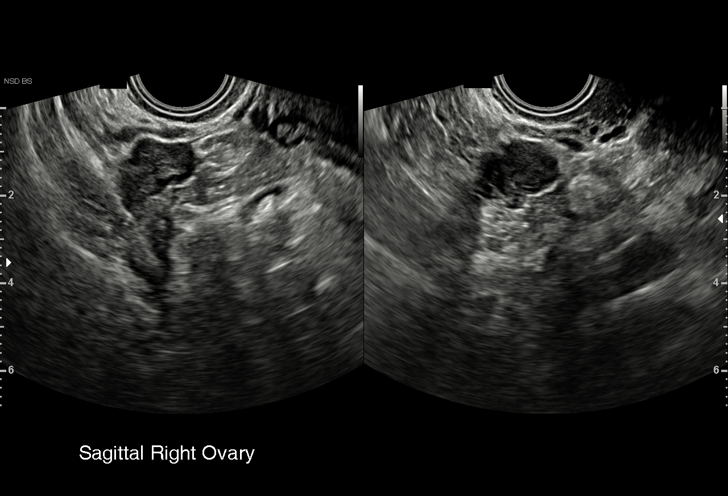
[im 30/36]
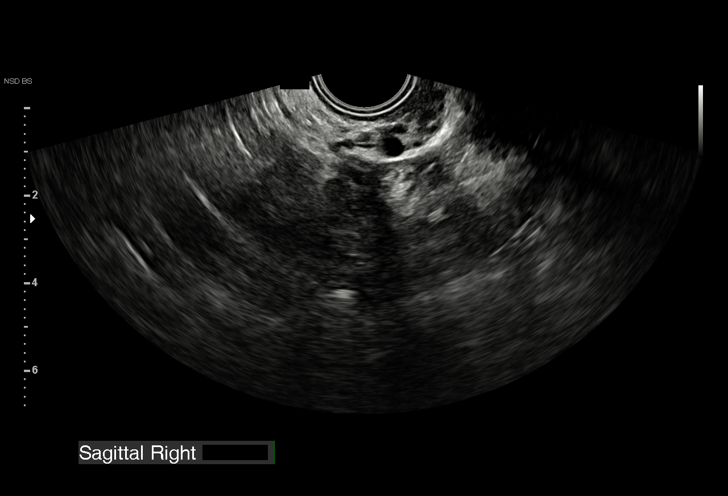
[im 33/36]
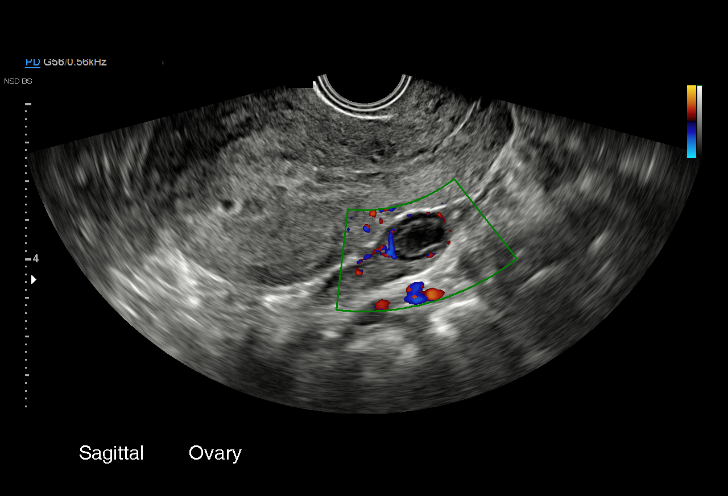
[im 36/36]
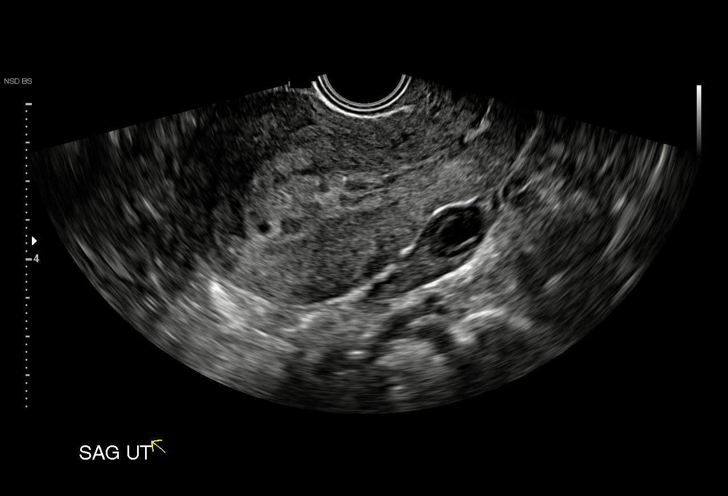

[15 of 28 positions shown; findings below may reference images not displayed]

FINDINGS: LMP: 02/11/2019

GA by LMP: 6 w  4 d

EDC by LMP: 11/18/2019

Intrauterine gestational sac: Single

Yolk sac:  Not Visualized.

Embryo:  Not Visualized.

Cardiac Activity: Not Visualized.

MSD: 3.1 mm   5 w   0 d

Subchorionic hemorrhage:  None visualized.

Maternal uterus/adnexae: Normal appearance of the uterus and
maternal ovaries. Corpus luteum noted in the left ovary. No free
fluid.
IMPRESSION: Single intrauterine gestational sac is visualized. Nonvisualization
of the fetal pole possibly to early to visualize given estimated
gestational age of 5 weeks 0 days by mean sac diameter measurement.
Recommend close clinical followup and serial quantitative beta HCGs
and ultrasounds.

## 2021-10-04 ENCOUNTER — Ambulatory Visit: Payer: PRIVATE HEALTH INSURANCE | Admitting: Physician Assistant

## 2021-12-23 ENCOUNTER — Encounter: Payer: Self-pay | Admitting: Radiology

## 2022-01-04 ENCOUNTER — Encounter: Payer: PRIVATE HEALTH INSURANCE | Admitting: Radiology

## 2022-01-11 ENCOUNTER — Encounter: Payer: PRIVATE HEALTH INSURANCE | Admitting: Radiology

## 2022-01-11 ENCOUNTER — Encounter: Payer: PRIVATE HEALTH INSURANCE | Admitting: Medical

## 2022-01-12 ENCOUNTER — Encounter: Payer: PRIVATE HEALTH INSURANCE | Admitting: Medical

## 2022-02-02 ENCOUNTER — Other Ambulatory Visit (HOSPITAL_COMMUNITY)
Admission: RE | Admit: 2022-02-02 | Discharge: 2022-02-02 | Disposition: A | Discharge status: Home / Self Care | Payer: PRIVATE HEALTH INSURANCE | Source: Ambulatory Visit | Attending: Radiology | Admitting: Radiology

## 2022-02-02 ENCOUNTER — Encounter: Payer: Self-pay | Admitting: Radiology

## 2022-02-02 ENCOUNTER — Encounter: Payer: PRIVATE HEALTH INSURANCE | Admitting: Radiology

## 2022-02-02 ENCOUNTER — Ambulatory Visit (INDEPENDENT_AMBULATORY_CARE_PROVIDER_SITE_OTHER): Payer: PRIVATE HEALTH INSURANCE | Admitting: Radiology

## 2022-02-02 VITALS — BP 114/66 | Ht 64.0 in | Wt 173.0 lb

## 2022-02-02 DIAGNOSIS — Z01419 Encounter for gynecological examination (general) (routine) without abnormal findings: Secondary | ICD-10-CM | POA: Insufficient documentation

## 2022-02-02 DIAGNOSIS — N898 Other specified noninflammatory disorders of vagina: Secondary | ICD-10-CM | POA: Diagnosis not present

## 2022-02-02 LAB — WET PREP FOR TRICH, YEAST, CLUE

## 2022-02-02 MED ORDER — TERCONAZOLE 0.4 % VA CREA: 1.0000 | TOPICAL_CREAM | Freq: Every day | VAGINAL | 0 refills | Status: DC

## 2022-02-02 NOTE — Progress Notes (Signed)
Ann Everett 04-24-1981 [id number]   History:  41 y.o. G3P2 presents for annual exam. C/o vaginal itching x 4-5 days  Gynecologic History Patient's last menstrual period was 01/17/2022 (exact date). Period Cycle (Days): 28 Period Duration (Days): 5 Period Pattern: Regular Menstrual Flow: Light, Heavy (light then heavy x's 2 days) Menstrual Control: Maxi pad Dysmenorrhea: None Contraception/Family planning: condoms Sexually active: yes Last Pap: 2014. Results were: normal Last mammogram: never.   Obstetric History OB History  Gravida Para Term Preterm AB Living  3 2 2     2   SAB IAB Ectopic Multiple Live Births          2    # Outcome Date GA Lbr Len/2nd Weight Sex Delivery Anes PTL Lv  3 Gravida           2 Term 03/10/13 [redacted]w[redacted]d 09:35 / 01:02 9 lb 0.1 oz (4.085 kg) M Vag-Spont EPI  LIV  1 Term 05/2010 [redacted]w[redacted]d 03:00 7 lb 11 oz (3.487 kg) F Vag-Spont None  LIV     Birth Comments: NO COMP     The following portions of the patient's history were reviewed and updated as appropriate: current medications, past family history, past medical history, past social history, past surgical history, and problem list.  Review of Systems Pertinent items noted in HPI and remainder of comprehensive ROS otherwise negative.   Past medical history, past surgical history, family history and social history were all reviewed and documented in the EPIC chart.   Exam:  Vitals:   02/02/22 1401  BP: 114/66  Weight: 173 lb (78.5 kg)  Height: 5\' 4"  (1.626 m)   Body mass index is 29.7 kg/m.  General appearance:  Normal Thyroid:  Symmetrical, normal in size, without palpable masses or nodularity. Respiratory  Auscultation:  Clear without wheezing or rhonchi Cardiovascular  Auscultation:  Regular rate, without rubs, murmurs or gallops  Edema/varicosities:  Not grossly evident Abdominal  Soft,nontender, without masses, guarding or rebound.  Liver/spleen:  No organomegaly noted  Hernia:  None  appreciated  Skin  Inspection:  Grossly normal Breasts: Examined lying and sitting.   Right: Without masses, retractions, nipple discharge or axillary adenopathy.   Left: Without masses, retractions, nipple discharge or axillary adenopathy. Genitourinary   Inguinal/mons:  Normal without inguinal adenopathy  External genitalia:  Normal appearing vulva with no masses, tenderness, or lesions  BUS/Urethra/Skene's glands:  Normal without masses or exudate  Vagina:  no lesions, + thick, white d/c  Cervix:  Normal appearing without discharge or lesions  Uterus:  Normal in size, shape and contour.  Mobile, nontender  Adnexa/parametria:     Rt: Normal in size, without masses or tenderness.   Lt: Normal in size, without masses or tenderness.  Anus and perineum: Normal   Patient informed chaperone available to be present for breast and pelvic exam. Patient has requested no chaperone to be present. Patient has been advised what will be completed during breast and pelvic exam.  Microscopic wet-mount exam shows hyphae.   Assessment/Plan:   1. Well woman exam with routine gynecological exam - Schedule screening mammogram at [facility] - PAP( [place])  2. Vaginal itching + yeast on wet prep Prefers terazol, rx sent  - WET PREP FOR TRICH, YEAST, CLUE    Discussed SBE,pap, mammogram, colonoscopy screening as directed/appropriate. Recommend 04/04/22 of exercise weekly, including weight bearing exercise. Encouraged the use of seatbelts and sunscreen. Return in 1 year for annual or as needed.  Ann Everett B WHNP-BC 2:15 PM 02/02/2022

## 2022-02-03 ENCOUNTER — Ambulatory Visit (INDEPENDENT_AMBULATORY_CARE_PROVIDER_SITE_OTHER): Payer: PRIVATE HEALTH INSURANCE | Admitting: Medical

## 2022-02-03 ENCOUNTER — Encounter: Payer: Self-pay | Admitting: Medical

## 2022-02-03 VITALS — BP 120/68 | HR 65 | Temp 98.4°F | Resp 18 | Ht 64.0 in | Wt 172.4 lb

## 2022-02-03 DIAGNOSIS — R5383 Other fatigue: Secondary | ICD-10-CM

## 2022-02-03 DIAGNOSIS — E559 Vitamin D deficiency, unspecified: Secondary | ICD-10-CM | POA: Diagnosis not present

## 2022-02-03 DIAGNOSIS — E538 Deficiency of other specified B group vitamins: Secondary | ICD-10-CM

## 2022-02-03 DIAGNOSIS — Z0000 Encounter for general adult medical examination without abnormal findings: Secondary | ICD-10-CM

## 2022-02-03 DIAGNOSIS — Z833 Family history of diabetes mellitus: Secondary | ICD-10-CM

## 2022-02-03 DIAGNOSIS — N898 Other specified noninflammatory disorders of vagina: Secondary | ICD-10-CM

## 2022-02-03 LAB — VITAMIN D 25 HYDROXY (VIT D DEFICIENCY, FRACTURES): VITD: 12.31 ng/mL — ABNORMAL LOW (ref 30.00–100.00)

## 2022-02-03 LAB — COMPREHENSIVE METABOLIC PANEL
ALT: 16 U/L (ref 0–35)
AST: 18 U/L (ref 0–37)
Albumin: 3.9 g/dL (ref 3.5–5.2)
Alkaline Phosphatase: 70 U/L (ref 39–117)
BUN: 10 mg/dL (ref 6–23)
CO2: 28 mEq/L (ref 19–32)
Calcium: 9 mg/dL (ref 8.4–10.5)
Chloride: 104 mEq/L (ref 96–112)
Creatinine, Ser: 0.68 mg/dL (ref 0.40–1.20)
GFR: 108.74 mL/min (ref >60.00)
Glucose, Bld: 85 mg/dL (ref 70–99)
Potassium: 4.5 mEq/L (ref 3.5–5.1)
Sodium: 137 mEq/L (ref 135–145)
Total Bilirubin: 0.4 mg/dL (ref 0.2–1.2)
Total Protein: 6.9 g/dL (ref 6.0–8.3)

## 2022-02-03 LAB — HEMOGLOBIN A1C: Hgb A1c MFr Bld: 5.9 % (ref 4.6–6.5)

## 2022-02-03 LAB — CBC WITH DIFFERENTIAL/PLATELET
Basophils Absolute: 0 10*3/uL (ref 0.0–0.1)
Basophils Relative: 0.4 % (ref 0.0–3.0)
Eosinophils Absolute: 0.1 10*3/uL (ref 0.0–0.7)
Eosinophils Relative: 2 % (ref 0.0–5.0)
HCT: 38 % (ref 36.0–46.0)
Hemoglobin: 12.6 g/dL (ref 12.0–15.0)
Lymphocytes Relative: 49 % — ABNORMAL HIGH (ref 12.0–46.0)
Lymphs Abs: 2.4 10*3/uL (ref 0.7–4.0)
MCHC: 33.1 g/dL (ref 30.0–36.0)
MCV: 83.7 fl (ref 78.0–100.0)
Monocytes Absolute: 0.3 10*3/uL (ref 0.1–1.0)
Monocytes Relative: 6.5 % (ref 3.0–12.0)
Neutro Abs: 2.1 10*3/uL (ref 1.4–7.7)
Neutrophils Relative %: 42.1 % — ABNORMAL LOW (ref 43.0–77.0)
Platelets: 225 10*3/uL (ref 150.0–400.0)
RBC: 4.54 Mil/uL (ref 3.87–5.11)
RDW: 13.3 % (ref 11.5–15.5)
WBC: 4.9 10*3/uL (ref 4.0–10.5)

## 2022-02-03 LAB — TSH: TSH: 0.6 u[IU]/mL (ref 0.35–5.50)

## 2022-02-03 LAB — LIPID PANEL
Cholesterol: 230 mg/dL — ABNORMAL HIGH (ref 0–200)
HDL: 81.5 mg/dL (ref >39.00)
LDL Cholesterol: 131 mg/dL — ABNORMAL HIGH (ref 0–99)
NonHDL: 148.42
Total CHOL/HDL Ratio: 3
Triglycerides: 85 mg/dL (ref 0.0–149.0)
VLDL: 17 mg/dL (ref 0.0–40.0)

## 2022-02-03 LAB — VITAMIN B12: Vitamin B-12: 255 pg/mL (ref 211–911)

## 2022-02-03 LAB — T4, FREE: Free T4: 0.84 ng/dL (ref 0.60–1.60)

## 2022-02-03 MED ORDER — VITAMIN D (ERGOCALCIFEROL) 1.25 MG (50000 UNIT) PO CAPS
50000.0000 [IU] | ORAL_CAPSULE | ORAL | 0 refills | Status: DC
  Filled 2022-02-03: qty 4, 28d supply, fill #0
  Filled 2022-03-12: qty 4, 28d supply, fill #1

## 2022-02-03 NOTE — Addendum Note (Signed)
Addended by: Gwenevere Abbot on: 02/03/2022 08:01 PM   Modules accepted: Orders

## 2022-02-03 NOTE — Progress Notes (Signed)
   Subjective:    Patient ID: Ann Everett, female    DOB: 1981-01-07, 41 y.o.   MRN: [id number]  HPI Here for wellness exam with me.  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 Pt stopped  exercisng 4 months ago . Non smoker and no alcohol. Pt states is going to improve her diet.   Married- 2 children  Had pap done yesterday with gyn.  Pt states gyn order mammogram. Gyn told pt to call breast center.  Pt had tdap when 41 yr old per her report.   Review of Systems  Constitutional:  Negative for chills, fatigue and fever.  HENT:  Negative for congestion, ear discharge and ear pain.   Respiratory:  Negative for cough, chest tightness, shortness of breath and wheezing.   Cardiovascular:  Negative for chest pain and palpitations.  Gastrointestinal:  Negative for abdominal pain.  Genitourinary:  Negative for dysuria, flank pain and frequency.  Musculoskeletal:  Negative for back pain.       Objective:   Physical Exam        Assessment & Plan:   Patient Instructions  For you wellness exam today I have ordered cbc, cmp and lipid panel.   Will give tdap next year since last one was done 41 yo.  Recommend exercise and healthy diet.  We will let you know lab results as they come in.  Follow up date appointment will be determined after lab review.    For fatigue will get b12, b1, tsh, t4 and b1 level.  For vit d def recheck level today.  Follow up date to be determined after lab review.   Esperanza Richters, PA-C

## 2022-02-03 NOTE — Patient Instructions (Addendum)
For you wellness exam today I have ordered cbc, cmp and lipid panel.   Will give tdap next year since last one was done 41 yo.  Recommend exercise and healthy diet.  We will let you know lab results as they come in.  Follow up date appointment will be determined after lab review.    For fatigue will get b12, b1, tsh, t4 and b1 level.  For vit d def recheck level today.  Follow up date to be determined after lab review.   Preventive Care 58-93 Years Old, Female Preventive care refers to lifestyle choices and visits with your health care provider that can promote health and wellness. Preventive care visits are also called wellness exams. What can I expect for my preventive care visit? Counseling Your health care provider may ask you questions about your: Medical history, including: Past medical problems. Family medical history. Pregnancy history. Current health, including: Menstrual cycle. Method of birth control. Emotional well-being. Home life and relationship well-being. Sexual activity and sexual health. Lifestyle, including: Alcohol, nicotine or tobacco, and drug use. Access to firearms. Diet, exercise, and sleep habits. Work and work Statistician. Sunscreen use. Safety issues such as seatbelt and bike helmet use. Physical exam Your health care provider will check your: Height and weight. These may be used to calculate your BMI (body mass index). BMI is a measurement that tells if you are at a healthy weight. Waist circumference. This measures the distance around your waistline. This measurement also tells if you are at a healthy weight and may help predict your risk of certain diseases, such as type 2 diabetes and high blood pressure. Heart rate and blood pressure. Body temperature. Skin for abnormal spots. What immunizations do I need?  Vaccines are usually given at various ages, according to a schedule. Your health care provider will recommend vaccines for you  based on your age, medical history, and lifestyle or other factors, such as travel or where you work. What tests do I need? Screening Your health care provider may recommend screening tests for certain conditions. This may include: Lipid and cholesterol levels. Diabetes screening. This is done by checking your blood sugar (glucose) after you have not eaten for a while (fasting). Pelvic exam and Pap test. Hepatitis B test. Hepatitis C test. HIV (human immunodeficiency virus) test. STI (sexually transmitted infection) testing, if you are at risk. Lung cancer screening. Colorectal cancer screening. Mammogram. Talk with your health care provider about when you should start having regular mammograms. This may depend on whether you have a family history of breast cancer. BRCA-related cancer screening. This may be done if you have a family history of breast, ovarian, tubal, or peritoneal cancers. Bone density scan. This is done to screen for osteoporosis. Talk with your health care provider about your test results, treatment options, and if necessary, the need for more tests. Follow these instructions at home: Eating and drinking  Eat a diet that includes fresh fruits and vegetables, whole grains, lean protein, and low-fat dairy products. Take vitamin and mineral supplements as recommended by your health care provider. Do not drink alcohol if: Your health care provider tells you not to drink. You are pregnant, may be pregnant, or are planning to become pregnant. If you drink alcohol: Limit how much you have to 0-1 drink a day. Know how much alcohol is in your drink. In the [place] one drink equals one 12 oz bottle of beer (355 mL), one 5 oz glass of wine (148 mL), or  one 1 oz glass of hard liquor (44 mL). Lifestyle Brush your teeth every morning and night with fluoride toothpaste. Floss one time each day. Exercise for at least 30 minutes 5 or more days each week. Do not use any products that  contain nicotine or tobacco. These products include cigarettes, chewing tobacco, and vaping devices, such as e-cigarettes. If you need help quitting, ask your health care provider. Do not use drugs. If you are sexually active, practice safe sex. Use a condom or other form of protection to prevent STIs. If you do not wish to become pregnant, use a form of birth control. If you plan to become pregnant, see your health care provider for a prepregnancy visit. Take aspirin only as told by your health care provider. Make sure that you understand how much to take and what form to take. Work with your health care provider to find out whether it is safe and beneficial for you to take aspirin daily. Find healthy ways to manage stress, such as: Meditation, yoga, or listening to music. Journaling. Talking to a trusted person. Spending time with friends and family. Minimize exposure to UV radiation to reduce your risk of skin cancer. Safety Always wear your seat belt while driving or riding in a vehicle. Do not drive: If you have been drinking alcohol. Do not ride with someone who has been drinking. When you are tired or distracted. While texting. If you have been using any mind-altering substances or drugs. Wear a helmet and other protective equipment during sports activities. If you have firearms in your house, make sure you follow all gun safety procedures. Seek help if you have been physically or sexually abused. What's next? Visit your health care provider once a year for an annual wellness visit. Ask your health care provider how often you should have your eyes and teeth checked. Stay up to date on all vaccines. This information is not intended to replace advice given to you by your health care provider. Make sure you discuss any questions you have with your health care provider. Document Revised: 12/15/2020 Document Reviewed: 12/15/2020 Elsevier Patient Education  [place].

## 2022-02-06 ENCOUNTER — Other Ambulatory Visit (HOSPITAL_BASED_OUTPATIENT_CLINIC_OR_DEPARTMENT_OTHER): Payer: Self-pay

## 2022-02-06 LAB — CYTOLOGY - PAP
Adequacy: ABSENT
Comment: NEGATIVE
Diagnosis: NEGATIVE
High risk HPV: NEGATIVE

## 2022-02-08 ENCOUNTER — Encounter: Payer: PRIVATE HEALTH INSURANCE | Admitting: Nurse Practitioner

## 2022-02-09 LAB — VITAMIN B1: Vitamin B1 (Thiamine): 15 nmol/L (ref 8–30)

## 2022-02-21 NOTE — Telephone Encounter (Signed)
Pt called asking if there was any way for her to do the injections herself as she is not looking to take a bunch of time off work to do these.

## 2022-02-22 MED ORDER — "SYRINGE/NEEDLE (DISP) 25G X 5/8"" 3 ML MISC": 0 refills | Status: DC

## 2022-02-22 MED ORDER — CYANOCOBALAMIN 1000 MCG/ML IJ SOLN: INTRAMUSCULAR | 0 refills | Status: DC

## 2022-02-23 ENCOUNTER — Other Ambulatory Visit (HOSPITAL_BASED_OUTPATIENT_CLINIC_OR_DEPARTMENT_OTHER): Payer: Self-pay

## 2022-02-24 ENCOUNTER — Encounter: Payer: Self-pay | Admitting: *Deleted

## 2022-03-13 ENCOUNTER — Other Ambulatory Visit (HOSPITAL_BASED_OUTPATIENT_CLINIC_OR_DEPARTMENT_OTHER): Payer: Self-pay

## 2022-03-30 ENCOUNTER — Encounter: Payer: Self-pay | Admitting: Medical

## 2022-03-31 ENCOUNTER — Other Ambulatory Visit (HOSPITAL_BASED_OUTPATIENT_CLINIC_OR_DEPARTMENT_OTHER): Payer: Self-pay

## 2022-03-31 ENCOUNTER — Other Ambulatory Visit: Payer: Self-pay | Admitting: Medical

## 2022-03-31 DIAGNOSIS — Z1231 Encounter for screening mammogram for malignant neoplasm of breast: Secondary | ICD-10-CM

## 2022-03-31 MED ORDER — CYANOCOBALAMIN 1000 MCG/ML IJ SOLN
INTRAMUSCULAR | 0 refills | Status: DC
  Filled 2022-03-31: qty 1, 30d supply, fill #0

## 2022-03-31 MED ORDER — "SYRINGE/NEEDLE (DISP) 25G X 5/8"" 3 ML MISC"
0 refills | Status: DC
  Filled 2022-03-31: qty 5, 150d supply, fill #0

## 2022-04-13 ENCOUNTER — Ambulatory Visit
Admission: RE | Admit: 2022-04-13 | Discharge: 2022-04-13 | Disposition: A | Discharge status: Home / Self Care | Payer: PRIVATE HEALTH INSURANCE | Source: Ambulatory Visit

## 2022-04-13 DIAGNOSIS — Z1231 Encounter for screening mammogram for malignant neoplasm of breast: Secondary | ICD-10-CM

## 2022-04-14 ENCOUNTER — Other Ambulatory Visit: Payer: Self-pay | Admitting: Medical

## 2022-04-14 DIAGNOSIS — R928 Other abnormal and inconclusive findings on diagnostic imaging of breast: Secondary | ICD-10-CM

## 2022-04-20 ENCOUNTER — Other Ambulatory Visit: Payer: Self-pay | Admitting: Medical

## 2022-04-20 ENCOUNTER — Ambulatory Visit
Admission: RE | Admit: 2022-04-20 | Discharge: 2022-04-20 | Disposition: A | Discharge status: Home / Self Care | Payer: PRIVATE HEALTH INSURANCE | Source: Ambulatory Visit | Attending: Medical | Admitting: Medical

## 2022-04-20 DIAGNOSIS — R921 Mammographic calcification found on diagnostic imaging of breast: Secondary | ICD-10-CM

## 2022-04-20 DIAGNOSIS — R928 Other abnormal and inconclusive findings on diagnostic imaging of breast: Secondary | ICD-10-CM

## 2022-04-27 ENCOUNTER — Ambulatory Visit
Admission: RE | Admit: 2022-04-27 | Discharge: 2022-04-27 | Disposition: A | Discharge status: Home / Self Care | Payer: PRIVATE HEALTH INSURANCE | Source: Ambulatory Visit | Attending: Medical | Admitting: Medical

## 2022-04-27 DIAGNOSIS — R921 Mammographic calcification found on diagnostic imaging of breast: Secondary | ICD-10-CM

## 2022-08-09 ENCOUNTER — Other Ambulatory Visit (HOSPITAL_BASED_OUTPATIENT_CLINIC_OR_DEPARTMENT_OTHER): Payer: Self-pay

## 2022-08-09 ENCOUNTER — Other Ambulatory Visit: Payer: Self-pay | Admitting: Medical

## 2022-08-09 MED ORDER — VITAMIN D (ERGOCALCIFEROL) 1.25 MG (50000 UNIT) PO CAPS
50000.0000 [IU] | ORAL_CAPSULE | ORAL | 0 refills | Status: DC
  Filled 2022-08-09: qty 4, 28d supply, fill #0
  Filled 2022-11-28: qty 4, 28d supply, fill #1

## 2022-08-10 ENCOUNTER — Other Ambulatory Visit (HOSPITAL_BASED_OUTPATIENT_CLINIC_OR_DEPARTMENT_OTHER): Payer: Self-pay

## 2022-10-16 ENCOUNTER — Encounter: Payer: Self-pay | Admitting: *Deleted

## 2022-11-28 ENCOUNTER — Other Ambulatory Visit (HOSPITAL_BASED_OUTPATIENT_CLINIC_OR_DEPARTMENT_OTHER): Payer: Self-pay

## 2022-12-21 NOTE — Telephone Encounter (Signed)
Will route to provider for final review and close.  

## 2022-12-21 NOTE — Telephone Encounter (Signed)
If it continues another week schedule for evaluation, otherwise continue to track cycles

## 2022-12-21 NOTE — Telephone Encounter (Signed)
Continue to monitor and track bleeding pattern

## 2023-02-08 ENCOUNTER — Encounter: Payer: PRIVATE HEALTH INSURANCE | Admitting: Medical

## 2023-03-23 ENCOUNTER — Ambulatory Visit (INDEPENDENT_AMBULATORY_CARE_PROVIDER_SITE_OTHER): Payer: PRIVATE HEALTH INSURANCE | Admitting: Medical

## 2023-03-23 ENCOUNTER — Encounter: Payer: Self-pay | Admitting: Medical

## 2023-03-23 VITALS — BP 118/63 | HR 61 | Temp 98.0°F | Resp 16 | Ht 64.0 in | Wt 155.6 lb

## 2023-03-23 DIAGNOSIS — R5383 Other fatigue: Secondary | ICD-10-CM

## 2023-03-23 DIAGNOSIS — Z1231 Encounter for screening mammogram for malignant neoplasm of breast: Secondary | ICD-10-CM | POA: Diagnosis not present

## 2023-03-23 DIAGNOSIS — E559 Vitamin D deficiency, unspecified: Secondary | ICD-10-CM | POA: Diagnosis not present

## 2023-03-23 DIAGNOSIS — Z0000 Encounter for general adult medical examination without abnormal findings: Secondary | ICD-10-CM

## 2023-03-23 NOTE — Patient Instructions (Addendum)
For you wellness exam today I have ordered cbc, cmp and lipid panel.(Labs as future fasting)  Vaccine tdap declined today.  Mammogram order placed.  Recommend exercise and healthy diet.  We will let you know lab results as they come in.  Follow up date appointment will be determined after lab review.    For fatigue and vit d deficiency recheck b12 and vit d level.  Preventive Care 22-42 Years Old, Female Preventive care refers to lifestyle choices and visits with your health care provider that can promote health and wellness. Preventive care visits are also called wellness exams. What can I expect for my preventive care visit? Counseling Your health care provider may ask you questions about your: Medical history, including: Past medical problems. Family medical history. Pregnancy history. Current health, including: Menstrual cycle. Method of birth control. Emotional well-being. Home life and relationship well-being. Sexual activity and sexual health. Lifestyle, including: Alcohol, nicotine or tobacco, and drug use. Access to firearms. Diet, exercise, and sleep habits. Work and work Astronomer. Sunscreen use. Safety issues such as seatbelt and bike helmet use. Physical exam Your health care provider will check your: Height and weight. These may be used to calculate your BMI (body mass index). BMI is a measurement that tells if you are at a healthy weight. Waist circumference. This measures the distance around your waistline. This measurement also tells if you are at a healthy weight and may help predict your risk of certain diseases, such as type 2 diabetes and high blood pressure. Heart rate and blood pressure. Body temperature. Skin for abnormal spots. What immunizations do I need?  Vaccines are usually given at various ages, according to a schedule. Your health care provider will recommend vaccines for you based on your age, medical history, and lifestyle or other  factors, such as travel or where you work. What tests do I need? Screening Your health care provider may recommend screening tests for certain conditions. This may include: Lipid and cholesterol levels. Diabetes screening. This is done by checking your blood sugar (glucose) after you have not eaten for a while (fasting). Pelvic exam and Pap test. Hepatitis B test. Hepatitis C test. HIV (human immunodeficiency virus) test. STI (sexually transmitted infection) testing, if you are at risk. Lung cancer screening. Colorectal cancer screening. Mammogram. Talk with your health care provider about when you should start having regular mammograms. This may depend on whether you have a family history of breast cancer. BRCA-related cancer screening. This may be done if you have a family history of breast, ovarian, tubal, or peritoneal cancers. Bone density scan. This is done to screen for osteoporosis. Talk with your health care provider about your test results, treatment options, and if necessary, the need for more tests. Follow these instructions at home: Eating and drinking  Eat a diet that includes fresh fruits and vegetables, whole grains, lean protein, and low-fat dairy products. Take vitamin and mineral supplements as recommended by your health care provider. Do not drink alcohol if: Your health care provider tells you not to drink. You are pregnant, may be pregnant, or are planning to become pregnant. If you drink alcohol: Limit how much you have to 0-1 drink a day. Know how much alcohol is in your drink. In the [place] one drink equals one 12 oz bottle of beer (355 mL), one 5 oz glass of wine (148 mL), or one 1 oz glass of hard liquor (44 mL). Lifestyle Brush your teeth every morning and night with fluoride toothpaste.  Floss one time each day. Exercise for at least 30 minutes 5 or more days each week. Do not use any products that contain nicotine or tobacco. These products include  cigarettes, chewing tobacco, and vaping devices, such as e-cigarettes. If you need help quitting, ask your health care provider. Do not use drugs. If you are sexually active, practice safe sex. Use a condom or other form of protection to prevent STIs. If you do not wish to become pregnant, use a form of birth control. If you plan to become pregnant, see your health care provider for a prepregnancy visit. Take aspirin only as told by your health care provider. Make sure that you understand how much to take and what form to take. Work with your health care provider to find out whether it is safe and beneficial for you to take aspirin daily. Find healthy ways to manage stress, such as: Meditation, yoga, or listening to music. Journaling. Talking to a trusted person. Spending time with friends and family. Minimize exposure to UV radiation to reduce your risk of skin cancer. Safety Always wear your seat belt while driving or riding in a vehicle. Do not drive: If you have been drinking alcohol. Do not ride with someone who has been drinking. When you are tired or distracted. While texting. If you have been using any mind-altering substances or drugs. Wear a helmet and other protective equipment during sports activities. If you have firearms in your house, make sure you follow all gun safety procedures. Seek help if you have been physically or sexually abused. What's next? Visit your health care provider once a year for an annual wellness visit. Ask your health care provider how often you should have your eyes and teeth checked. Stay up to date on all vaccines. This information is not intended to replace advice given to you by your health care provider. Make sure you discuss any questions you have with your health care provider. Document Revised: 12/15/2020 Document Reviewed: 12/15/2020 Elsevier Patient Education  2024 [employer].

## 2023-03-23 NOTE — Progress Notes (Signed)
Subjective:    Patient ID: Ann Everett, female    DOB: 14-Aug-1980, 42 y.o.   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 Pt  walks 2-3 days at home after work . Non smoker and no alcohol. Pt states has been doing intermittent fasting 5 days a week.     Married- 2 children   Had pap done yesterday with gyn. Last year 2023.   Pt states gyn order mammogram.  Pt states she had biopsy done which was negative.    Pt had tdap when 42 yr old per her report.  Flu vaccine already at work.   Review of Systems  Constitutional:  Positive for fatigue. Negative for chills and fever.       Occasional fatigue.  HENT:  Negative for congestion, ear discharge and ear pain.   Respiratory:  Negative for cough, chest tightness, shortness of breath and wheezing.   Cardiovascular:  Negative for chest pain and palpitations.  Gastrointestinal:  Negative for abdominal pain.  Genitourinary:  Negative for dysuria, flank pain and frequency.  Musculoskeletal:  Negative for back pain.  Neurological:  Negative for dizziness.  Psychiatric/Behavioral:  Negative for behavioral problems and dysphoric mood.     Past Medical History:  Diagnosis Date   Infection    UTI     Social History   Socioeconomic History   Marital status: Married    Spouse name: TILLAL   Number of children: Not on file   Years of education: 16   Highest education level: Not on file  Occupational History   Occupation: MEDICAL LAB TECH  Tobacco Use   Smoking status: Never   Smokeless tobacco: Never  Substance and Sexual Activity   Alcohol use: No   Drug use: No   Sexual activity: Yes    Partners: Male    Birth control/protection: Condom  Other Topics Concern   Not on file  Social History Narrative   Not on file   Social Determinants of Health   Financial Resource Strain: Not on file  Food Insecurity: Not on file  Transportation Needs: Not on file  Physical Activity: Not on file  Stress: Not  on file  Social Connections: Unknown (11/14/2021)   [facility]   Social Network    Social Network: Not on file  Intimate Partner Violence: Unknown (10/06/2021)   Received from [facility], [facility]   HITS    Physically Hurt: Not on file    Insult or Talk Down To: Not on file    Threaten Physical Harm: Not on file    Scream or Curse: Not on file    Past Surgical History:  Procedure Laterality Date   WISDOM TOOTH EXTRACTION  2013    Family History  Problem Relation Age of Onset   Dementia Father     No Known Allergies  Current Outpatient Medications on File Prior to Visit  Medication Sig Dispense Refill   cyanocobalamin (VITAMIN B12) 1000 MCG/ML injection Inject 1 ml monthly for 5 months 5 mL 0   SYRINGE-NEEDLE, DISP, 3 ML 25G X 5/8" 3 ML MISC Use to inject b12 monthly for 5 months 5 each 0   Vitamin D, Ergocalciferol, (DRISDOL) 1.25 MG (50000 UNIT) CAPS capsule Take 1 capsule (50,000 Units total) by mouth every 7 (seven) days. 8 capsule 0   terconazole (TERAZOL 7) 0.4 % vaginal cream Place 1 applicator vaginally at bedtime. 45 g 0  No current facility-administered medications on file prior to visit.    BP 118/63 (BP Location: Left Arm, Patient Position: Sitting, Cuff Size: Normal)   Pulse 61   Temp 98 F (36.7 C) (Oral)   Resp 16   Ht 5\' 4"  (1.626 m)   Wt 155 lb 9.6 oz (70.6 kg)   SpO2 100%   BMI 26.71 kg/m        Objective:   Physical Exam  General Mental Status- Alert. General Appearance- Not in acute distress.   Skin General: Color- Normal Color. Moisture- Normal Moisture.  Neck Carotid Arteries- Normal color. Moisture- Normal Moisture. No carotid bruits. No JVD.  Chest and Lung Exam Auscultation: Breath Sounds:-Normal.  Cardiovascular Auscultation:Rythm- Regular. Murmurs & Other Heart Sounds:Auscultation of the heart reveals- No Murmurs.  Abdomen Inspection:-Inspeection Normal. Palpation/Percussion:Note:No  mass. Palpation and Percussion of the abdomen reveal- Non Tender, Non Distended + BS, no rebound or guarding.   Neurologic Cranial Nerve exam:- CN III-XII intact(No nystagmus), symmetric smile. Strength:- 5/5 equal and symmetric strength both upper and lower extremities.       Assessment & Plan:   Patient Instructions  For you wellness exam today I have ordered cbc, cmp and lipid panel.  Vaccine tdap declined today.  Recommend exercise and healthy diet.  We will let you know lab results as they come in.  Follow up date appointment will be determined after lab review.    For fatigue and vit d deficiency recheck b12 and vit d level.     Esperanza Richters, PA-C

## 2023-03-26 ENCOUNTER — Other Ambulatory Visit (INDEPENDENT_AMBULATORY_CARE_PROVIDER_SITE_OTHER): Payer: PRIVATE HEALTH INSURANCE

## 2023-03-26 DIAGNOSIS — Z13220 Encounter for screening for lipoid disorders: Secondary | ICD-10-CM | POA: Diagnosis not present

## 2023-03-26 DIAGNOSIS — R5383 Other fatigue: Secondary | ICD-10-CM | POA: Diagnosis not present

## 2023-03-26 DIAGNOSIS — Z0000 Encounter for general adult medical examination without abnormal findings: Secondary | ICD-10-CM | POA: Diagnosis not present

## 2023-03-26 DIAGNOSIS — E559 Vitamin D deficiency, unspecified: Secondary | ICD-10-CM

## 2023-03-26 LAB — COMPREHENSIVE METABOLIC PANEL
ALT: 8 U/L (ref 0–35)
AST: 12 U/L (ref 0–37)
Albumin: 3.7 g/dL (ref 3.5–5.2)
Alkaline Phosphatase: 64 U/L (ref 39–117)
BUN: 8 mg/dL (ref 6–23)
CO2: 26 mEq/L (ref 19–32)
Calcium: 9 mg/dL (ref 8.4–10.5)
Chloride: 105 mEq/L (ref 96–112)
Creatinine, Ser: 0.61 mg/dL (ref 0.40–1.20)
GFR: 110.74 mL/min (ref >60.00)
Glucose, Bld: 92 mg/dL (ref 70–99)
Potassium: 4.3 mEq/L (ref 3.5–5.1)
Sodium: 139 mEq/L (ref 135–145)
Total Bilirubin: 0.4 mg/dL (ref 0.2–1.2)
Total Protein: 6.6 g/dL (ref 6.0–8.3)

## 2023-03-26 LAB — LIPID PANEL
Cholesterol: 203 mg/dL — ABNORMAL HIGH (ref 0–200)
HDL: 73.6 mg/dL (ref >39.00)
LDL Cholesterol: 116 mg/dL — ABNORMAL HIGH (ref 0–99)
NonHDL: 129.44
Total CHOL/HDL Ratio: 3
Triglycerides: 65 mg/dL (ref 0.0–149.0)
VLDL: 13 mg/dL (ref 0.0–40.0)

## 2023-03-26 LAB — CBC WITH DIFFERENTIAL/PLATELET
Basophils Absolute: 0 10*3/uL (ref 0.0–0.1)
Basophils Relative: 0.6 % (ref 0.0–3.0)
Eosinophils Absolute: 0.1 10*3/uL (ref 0.0–0.7)
Eosinophils Relative: 1.3 % (ref 0.0–5.0)
HCT: 38.7 % (ref 36.0–46.0)
Hemoglobin: 12.2 g/dL (ref 12.0–15.0)
Lymphocytes Relative: 57.4 % — ABNORMAL HIGH (ref 12.0–46.0)
Lymphs Abs: 2.7 10*3/uL (ref 0.7–4.0)
MCHC: 31.5 g/dL (ref 30.0–36.0)
MCV: 84.4 fl (ref 78.0–100.0)
Monocytes Absolute: 0.3 10*3/uL (ref 0.1–1.0)
Monocytes Relative: 5.9 % (ref 3.0–12.0)
Neutro Abs: 1.8 10*3/uL (ref 1.4–7.7)
Neutrophils Relative %: 36.5 % — ABNORMAL LOW (ref 43.0–77.0)
Platelets: 254 10*3/uL (ref 150.0–400.0)
RBC: 4.59 Mil/uL (ref 3.87–5.11)
RDW: 14.2 % (ref 11.5–15.5)
WBC: 4.9 10*3/uL (ref 4.0–10.5)

## 2023-03-26 LAB — VITAMIN B12: Vitamin B-12: 312 pg/mL (ref 211–911)

## 2023-03-26 LAB — VITAMIN D 25 HYDROXY (VIT D DEFICIENCY, FRACTURES): VITD: 20.75 ng/mL — ABNORMAL LOW (ref 30.00–100.00)

## 2023-03-26 MED ORDER — VITAMIN D (ERGOCALCIFEROL) 1.25 MG (50000 UNIT) PO CAPS
50000.0000 [IU] | ORAL_CAPSULE | ORAL | 0 refills | Status: DC
  Filled 2023-03-26: qty 8, 56d supply, fill #0
  Filled 2023-04-11: qty 4, 28d supply, fill #0
  Filled 2023-06-20: qty 4, 28d supply, fill #1

## 2023-03-26 NOTE — Addendum Note (Signed)
Addended by: Gwenevere Abbot on: 03/26/2023 10:05 PM   Modules accepted: Orders

## 2023-03-27 ENCOUNTER — Other Ambulatory Visit (HOSPITAL_BASED_OUTPATIENT_CLINIC_OR_DEPARTMENT_OTHER): Payer: Self-pay

## 2023-03-27 MED ORDER — CYANOCOBALAMIN 1000 MCG/ML IJ SOLN
INTRAMUSCULAR | 0 refills | Status: DC
  Filled 2023-03-27: qty 5, 30d supply, fill #0
  Filled 2023-03-27 – 2023-04-11 (×2): qty 4, 28d supply, fill #0
  Filled 2023-06-20: qty 4, 28d supply, fill #1
  Filled 2023-06-20: qty 1, 7d supply, fill #1
  Filled 2023-06-22: qty 1, 7d supply, fill #0

## 2023-03-27 MED ORDER — "SYRINGE/NEEDLE (DISP) 25G X 5/8"" 3 ML MISC"
0 refills | Status: AC
  Filled 2023-03-27 (×2): qty 5, 30d supply, fill #0
  Filled 2023-04-11: qty 4, 4d supply, fill #0
  Filled 2023-06-20: qty 1, 1d supply, fill #1
  Filled 2023-06-20: qty 4, 4d supply, fill #1

## 2023-04-05 ENCOUNTER — Other Ambulatory Visit (HOSPITAL_COMMUNITY): Payer: Self-pay

## 2023-04-05 MED ORDER — RIFAMPIN 300 MG PO CAPS
300.0000 mg | ORAL_CAPSULE | Freq: Two times a day (BID) | ORAL | 0 refills | Status: DC
  Filled 2023-04-05: qty 42, 21d supply, fill #0

## 2023-04-05 MED ORDER — DOXYCYCLINE HYCLATE 100 MG PO TABS
100.0000 mg | ORAL_TABLET | Freq: Two times a day (BID) | ORAL | 0 refills | Status: DC
  Filled 2023-04-05: qty 42, 21d supply, fill #0

## 2023-04-09 ENCOUNTER — Other Ambulatory Visit (HOSPITAL_BASED_OUTPATIENT_CLINIC_OR_DEPARTMENT_OTHER): Payer: Self-pay

## 2023-04-11 ENCOUNTER — Other Ambulatory Visit (HOSPITAL_BASED_OUTPATIENT_CLINIC_OR_DEPARTMENT_OTHER): Payer: Self-pay

## 2023-04-26 ENCOUNTER — Ambulatory Visit: Payer: PRIVATE HEALTH INSURANCE

## 2023-06-20 ENCOUNTER — Other Ambulatory Visit (HOSPITAL_COMMUNITY): Payer: Self-pay

## 2023-06-20 ENCOUNTER — Encounter: Payer: Self-pay | Admitting: Medical

## 2023-06-22 ENCOUNTER — Other Ambulatory Visit (HOSPITAL_COMMUNITY): Payer: Self-pay

## 2023-11-12 ENCOUNTER — Other Ambulatory Visit: Payer: Self-pay | Admitting: Medical

## 2023-11-12 ENCOUNTER — Other Ambulatory Visit (HOSPITAL_COMMUNITY): Payer: Self-pay

## 2023-11-12 MED ORDER — CYANOCOBALAMIN 1000 MCG/ML IJ SOLN
INTRAMUSCULAR | 0 refills | Status: AC
Start: 2023-11-12 — End: ?
  Filled 2023-11-12: qty 4, 28d supply, fill #0
  Filled 2024-03-18 – 2024-04-18 (×2): qty 1, 7d supply, fill #1

## 2023-11-12 MED ORDER — VITAMIN D (ERGOCALCIFEROL) 1.25 MG (50000 UNIT) PO CAPS
50000.0000 [IU] | ORAL_CAPSULE | ORAL | 0 refills | Status: AC
Start: 2023-11-12 — End: ?
  Filled 2023-11-12: qty 4, 28d supply, fill #0
  Filled 2024-03-18 – 2024-04-18 (×2): qty 4, 28d supply, fill #1

## 2023-12-07 ENCOUNTER — Telehealth: Payer: Self-pay

## 2023-12-07 NOTE — Telephone Encounter (Signed)
 You  Verma SaadYesterday (8:38 AM)    Good morning Ann Everett. What is the specific reasoning for wanting the labs done? Fertility, checking for menopause, or any other specific symptoms? Thanks in advance.   This MyChart message has not been read.   [name]  [facility] TriageYesterday (8:28 AM)   TH Good morning can you guys take a look at to see if orders can be placed before her being seen with Ann?     [name]  P [facility] Admin (supporting Ann B Chrzanowski, NP)2 days ago    No, it's really hard for me to come in the morning, I thought she might be able to put the lab orders before the appointment because it's a time sensitive but since she is out it can be done.     [name]  [name]2 days ago   TH Good afternoon [name] Ann is out this week.Her first availability would be 6/24 @930 . Let me know if that works for you?    [name]  P [facility] Admin (supporting Ann B Chrzanowski, NP)2 days ago    Appointment Request From: Ann Everett   With Provider: CHRZANOWSKI, Ann B [[facility] [place]]   Preferred Date Range: 12/05/2023 - 12/06/2023   Preferred Times: Any Time   Reason for visit: Office Visit   Health Maintenance Topic:    Comments: I want my hormones to be checked, and I know that is should be in 21 days from last cycle starts9 my last was on 05/14). can I at least get the lab tests done before the appointment if it's not available for today or tomorrow(appointment after 3 pm is preferred)

## 2024-02-15 ENCOUNTER — Other Ambulatory Visit: Payer: Self-pay | Admitting: Medical

## 2024-02-15 DIAGNOSIS — Z1231 Encounter for screening mammogram for malignant neoplasm of breast: Secondary | ICD-10-CM

## 2024-03-18 ENCOUNTER — Other Ambulatory Visit (HOSPITAL_COMMUNITY): Payer: Self-pay

## 2024-03-18 ENCOUNTER — Other Ambulatory Visit: Payer: Self-pay | Admitting: Medical

## 2024-03-18 ENCOUNTER — Encounter (HOSPITAL_COMMUNITY): Payer: Self-pay

## 2024-03-18 ENCOUNTER — Other Ambulatory Visit: Payer: Self-pay

## 2024-03-20 NOTE — Telephone Encounter (Signed)
 Saw pt one year ago. No recent b12. Will  probably refill on follow up after discuss b12 plan going forward. Will also get level. Appointment upcoming in about a week.

## 2024-03-21 ENCOUNTER — Encounter (HOSPITAL_COMMUNITY): Payer: Self-pay

## 2024-03-21 ENCOUNTER — Other Ambulatory Visit (HOSPITAL_COMMUNITY): Payer: Self-pay

## 2024-03-27 ENCOUNTER — Other Ambulatory Visit (HOSPITAL_COMMUNITY): Payer: Self-pay

## 2024-03-28 ENCOUNTER — Ambulatory Visit: Payer: PRIVATE HEALTH INSURANCE | Admitting: Medical

## 2024-03-28 VITALS — BP 122/80 | HR 67 | Temp 98.7°F | Resp 17 | Ht 64.0 in | Wt 169.2 lb

## 2024-03-28 DIAGNOSIS — E559 Vitamin D deficiency, unspecified: Secondary | ICD-10-CM

## 2024-03-28 DIAGNOSIS — Z833 Family history of diabetes mellitus: Secondary | ICD-10-CM | POA: Diagnosis not present

## 2024-03-28 DIAGNOSIS — R739 Hyperglycemia, unspecified: Secondary | ICD-10-CM

## 2024-03-28 DIAGNOSIS — E538 Deficiency of other specified B group vitamins: Secondary | ICD-10-CM

## 2024-03-28 DIAGNOSIS — Z0000 Encounter for general adult medical examination without abnormal findings: Secondary | ICD-10-CM

## 2024-03-28 NOTE — Progress Notes (Signed)
 Subjective:    Patient ID: Ann Everett, female    DOB: [birth year], 43 y.o.   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 Pt  walk less  at home after work . Non smoker and no alcohol. Pt states has been doing intermittent fasting 5 days a week.     Married- 2 children   Had pap done yesterday with gyn. Last year 2023.   Pt states gyn order mammogram thru gyn.    Pt up to date on pap. States done 2 years ago  Tdap deferred to later date   Flu vaccine already at work.  Pt states hep b antibody done 2 years ago showed immunity. Per pt report.   Review of Systems  Constitutional:  Negative for chills, fatigue and fever.  HENT:  Negative for congestion, ear discharge and ear pain.   Respiratory:  Negative for cough, chest tightness, shortness of breath and wheezing.   Cardiovascular:  Negative for chest pain and palpitations.  Gastrointestinal:  Negative for abdominal pain.  Genitourinary:  Negative for dysuria, flank pain and frequency.  Musculoskeletal:  Negative for back pain.  Neurological:  Negative for dizziness.  Psychiatric/Behavioral:  Negative for behavioral problems and dysphoric mood.     Past Medical History:  Diagnosis Date   Infection    UTI     Social History   Socioeconomic History   Marital status: Married    Spouse name: TILLAL   Number of children: Not on file   Years of education: 16   Highest education level: Bachelor's degree (e.g., BA, AB, BS)  Occupational History   Occupation: MEDICAL LAB TECH  Tobacco Use   Smoking status: Never   Smokeless tobacco: Never  Substance and Sexual Activity   Alcohol use: No   Drug use: No   Sexual activity: Yes    Partners: Male    Birth control/protection: Condom  Other Topics Concern   Not on file  Social History Narrative   Not on file   Social Drivers of Health   Financial Resource Strain: Low Risk  (03/27/2024)   Overall Financial Resource Strain (CARDIA)     Difficulty of Paying Living Expenses: Not hard at all  Food Insecurity: No Food Insecurity (03/27/2024)   Hunger Vital Sign    Worried About Running Out of Food in the Last Year: Never true    Ran Out of Food in the Last Year: Never true  Transportation Needs: No Transportation Needs (03/27/2024)   PRAPARE - Administrator, Civil Service (Medical): No    Lack of Transportation (Non-Medical): No  Physical Activity: Insufficiently Active (03/27/2024)   Exercise Vital Sign    Days of Exercise per Week: 1 day    Minutes of Exercise per Session: 30 min  Stress: No Stress Concern Present (03/27/2024)   [employer] of Occupational Health - Occupational Stress Questionnaire    Feeling of Stress: Only a little  Social Connections: Moderately Isolated (03/27/2024)   Social Connection and Isolation Panel    Frequency of Communication with Friends and Family: More than three times a week    Frequency of Social Gatherings with Friends and Family: Once a week    Attends Religious Services: Never    Database administrator or Organizations: No    Attends Banker Meetings: Not on file    Marital Status: Married  Intimate Partner Violence: Unknown (10/06/2021)   Received from  [facility]   HITS    Physically Hurt: Not on file    Insult or Talk Down To: Not on file    Threaten Physical Harm: Not on file    Scream or Curse: Not on file    Past Surgical History:  Procedure Laterality Date   WISDOM TOOTH EXTRACTION  2013    Family History  Problem Relation Age of Onset   Dementia Father     No Known Allergies  Current Outpatient Medications on File Prior to Visit  Medication Sig Dispense Refill   cyanocobalamin  (VITAMIN B12) 1000 MCG/ML injection Inject 1 ml weekly for 4 weeks 5 mL 0   SYRINGE-NEEDLE, DISP, 3 ML 25G X 5/8 3 ML MISC Use to inject B-12 weekly. 5 each 0   Vitamin D , Ergocalciferol , (DRISDOL ) 1.25 MG (50000 UNIT) CAPS capsule Take 1 capsule (50,000  Units total) by mouth every 7 (seven) days. 8 capsule 0   No current facility-administered medications on file prior to visit.    BP 122/80   Pulse 67   Temp 98.7 F (37.1 C) (Oral)   Resp 17   Ht 5' 4 (1.626 m)   Wt 169 lb 3.2 oz (76.7 kg)   LMP 03/04/2024 (Approximate)   SpO2 99%   BMI 29.04 kg/m        Objective:   Physical Exam  General Mental Status- Alert. General Appearance- Not in acute distress.   Skin General: Color- Normal Color. Moisture- Normal Moisture.  Neck Carotid Arteries- Normal color. Moisture- Normal Moisture. No carotid bruits. No JVD.  Chest and Lung Exam Auscultation: Breath Sounds:-CTA  Cardiovascular Auscultation:Rythm- RRR Murmurs & Other Heart Sounds:Auscultation of the heart reveals- No Murmurs.  Abdomen Inspection:-Inspeection Normal. Palpation/Percussion:Note:No mass. Palpation and Percussion of the abdomen reveal- Non Tender, Non Distended + BS, no rebound or guarding.   Neurologic Cranial Nerve exam:- CN III-XII intact(No nystagmus), symmetric smile. Strength:- 5/5 equal and symmetric strength both upper and lower extremities.       Assessment & Plan:   Patient Instructions  For you wellness exam today I have ordered cbc, cmp, lipid panel, A1c  and vit d.  Flu vaccine today. Tdap deferred.  Recommend exercise and healthy diet.  We will let you know lab results as they come in.  Follow up date appointment will be determined after lab review.       Yahya Boldman, PA-C

## 2024-03-28 NOTE — Patient Instructions (Addendum)
 For you wellness exam today I have ordered cbc, cmp, lipid panel, A1c  and vit d.  Flu vaccine today. Tdap deferred.  Mammogram ordered thru gyn. Up to date on pap.  Recommend exercise and healthy diet.  We will let you know lab results as they come in.  Follow up date appointment will be determined after lab review.    Preventive Care 74-43 Years Old, Female Preventive care refers to lifestyle choices and visits with your health care provider that can promote health and wellness. Preventive care visits are also called wellness exams. What can I expect for my preventive care visit? Counseling Your health care provider may ask you questions about your: Medical history, including: Past medical problems. Family medical history. Pregnancy history. Current health, including: Menstrual cycle. Method of birth control. Emotional well-being. Home life and relationship well-being. Sexual activity and sexual health. Lifestyle, including: Alcohol, nicotine or tobacco, and drug use. Access to firearms. Diet, exercise, and sleep habits. Work and work Astronomer. Sunscreen use. Safety issues such as seatbelt and bike helmet use. Physical exam Your health care provider will check your: Height and weight. These may be used to calculate your BMI (body mass index). BMI is a measurement that tells if you are at a healthy weight. Waist circumference. This measures the distance around your waistline. This measurement also tells if you are at a healthy weight and may help predict your risk of certain diseases, such as type 2 diabetes and high blood pressure. Heart rate and blood pressure. Body temperature. Skin for abnormal spots. What immunizations do I need?  Vaccines are usually given at various ages, according to a schedule. Your health care provider will recommend vaccines for you based on your age, medical history, and lifestyle or other factors, such as travel or where you work. What  tests do I need? Screening Your health care provider may recommend screening tests for certain conditions. This may include: Lipid and cholesterol levels. Diabetes screening. This is done by checking your blood sugar (glucose) after you have not eaten for a while (fasting). Pelvic exam and Pap test. Hepatitis B test. Hepatitis C test. HIV (human immunodeficiency virus) test. STI (sexually transmitted infection) testing, if you are at risk. Lung cancer screening. Colorectal cancer screening. Mammogram. Talk with your health care provider about when you should start having regular mammograms. This may depend on whether you have a family history of breast cancer. BRCA-related cancer screening. This may be done if you have a family history of breast, ovarian, tubal, or peritoneal cancers. Bone density scan. This is done to screen for osteoporosis. Talk with your health care provider about your test results, treatment options, and if necessary, the need for more tests. Follow these instructions at home: Eating and drinking  Eat a diet that includes fresh fruits and vegetables, whole grains, lean protein, and low-fat dairy products. Take vitamin and mineral supplements as recommended by your health care provider. Do not drink alcohol if: Your health care provider tells you not to drink. You are pregnant, may be pregnant, or are planning to become pregnant. If you drink alcohol: Limit how much you have to 0-1 drink a day. Know how much alcohol is in your drink. In the [place] one drink equals one 12 oz bottle of beer (355 mL), one 5 oz glass of wine (148 mL), or one 1 oz glass of hard liquor (44 mL). Lifestyle Brush your teeth every morning and night with fluoride toothpaste. Floss one time each day.  Exercise for at least 30 minutes 5 or more days each week. Do not use any products that contain nicotine or tobacco. These products include cigarettes, chewing tobacco, and vaping devices, such as  e-cigarettes. If you need help quitting, ask your health care provider. Do not use drugs. If you are sexually active, practice safe sex. Use a condom or other form of protection to prevent STIs. If you do not wish to become pregnant, use a form of birth control. If you plan to become pregnant, see your health care provider for a prepregnancy visit. Take aspirin only as told by your health care provider. Make sure that you understand how much to take and what form to take. Work with your health care provider to find out whether it is safe and beneficial for you to take aspirin daily. Find healthy ways to manage stress, such as: Meditation, yoga, or listening to music. Journaling. Talking to a trusted person. Spending time with friends and family. Minimize exposure to UV radiation to reduce your risk of skin cancer. Safety Always wear your seat belt while driving or riding in a vehicle. Do not drive: If you have been drinking alcohol. Do not ride with someone who has been drinking. When you are tired or distracted. While texting. If you have been using any mind-altering substances or drugs. Wear a helmet and other protective equipment during sports activities. If you have firearms in your house, make sure you follow all gun safety procedures. Seek help if you have been physically or sexually abused. What's next? Visit your health care provider once a year for an annual wellness visit. Ask your health care provider how often you should have your eyes and teeth checked. Stay up to date on all vaccines. This information is not intended to replace advice given to you by your health care provider. Make sure you discuss any questions you have with your health care provider. Document Revised: 12/15/2020 Document Reviewed: 12/15/2020 Elsevier Patient Education  2024 [employer].

## 2024-03-29 ENCOUNTER — Ambulatory Visit: Payer: Self-pay | Admitting: Medical

## 2024-03-29 LAB — COMPREHENSIVE METABOLIC PANEL WITH GFR
AG Ratio: 1.3 (calc) (ref 1.0–2.5)
ALT: 9 U/L (ref 6–29)
AST: 13 U/L (ref 10–30)
Albumin: 4.1 g/dL (ref 3.6–5.1)
Alkaline phosphatase (APISO): 62 U/L (ref 31–125)
BUN: 15 mg/dL (ref 7–25)
CO2: 26 mmol/L (ref 20–32)
Calcium: 9.1 mg/dL (ref 8.6–10.2)
Chloride: 104 mmol/L (ref 98–110)
Creat: 0.6 mg/dL (ref 0.50–0.99)
Globulin: 3.1 g/dL (ref 1.9–3.7)
Glucose, Bld: 91 mg/dL (ref 65–99)
Potassium: 3.9 mmol/L (ref 3.5–5.3)
Sodium: 138 mmol/L (ref 135–146)
Total Bilirubin: 0.3 mg/dL (ref 0.2–1.2)
Total Protein: 7.2 g/dL (ref 6.1–8.1)
eGFR: 115 mL/min/1.73m2 (ref >=60)

## 2024-03-29 LAB — CBC WITH DIFFERENTIAL/PLATELET
Absolute Lymphocytes: 2645 {cells}/uL (ref 850–3900)
Absolute Monocytes: 469 {cells}/uL (ref 200–950)
Basophils Absolute: 27 {cells}/uL (ref 0–200)
Basophils Relative: 0.4 %
Eosinophils Absolute: 41 {cells}/uL (ref 15–500)
Eosinophils Relative: 0.6 %
HCT: 37.6 % (ref 35.0–45.0)
Hemoglobin: 12.1 g/dL (ref 11.7–15.5)
MCH: 26.4 pg — ABNORMAL LOW (ref 27.0–33.0)
MCHC: 32.2 g/dL (ref 32.0–36.0)
MCV: 82.1 fL (ref 80.0–100.0)
MPV: 10.8 fL (ref 7.5–12.5)
Monocytes Relative: 6.9 %
Neutro Abs: 3618 {cells}/uL (ref 1500–7800)
Neutrophils Relative %: 53.2 %
Platelets: 247 Thousand/uL (ref 140–400)
RBC: 4.58 Million/uL (ref 3.80–5.10)
RDW: 13.8 % (ref 11.0–15.0)
Total Lymphocyte: 38.9 %
WBC: 6.8 Thousand/uL (ref 3.8–10.8)

## 2024-03-29 LAB — VITAMIN D 25 HYDROXY (VIT D DEFICIENCY, FRACTURES): Vit D, 25-Hydroxy: 24 ng/mL — ABNORMAL LOW (ref 30–100)

## 2024-03-29 LAB — HEMOGLOBIN A1C
Hgb A1c MFr Bld: 5.8 % — ABNORMAL HIGH (ref <5.7)
Mean Plasma Glucose: 120 mg/dL
eAG (mmol/L): 6.6 mmol/L

## 2024-03-29 LAB — LIPID PANEL
Cholesterol: 215 mg/dL — ABNORMAL HIGH (ref <200)
HDL: 86 mg/dL (ref >=50)
LDL Cholesterol (Calc): 111 mg/dL — ABNORMAL HIGH
Non-HDL Cholesterol (Calc): 129 mg/dL (ref <130)
Total CHOL/HDL Ratio: 2.5 (calc) (ref <5.0)
Triglycerides: 87 mg/dL (ref <150)

## 2024-03-29 NOTE — Addendum Note (Signed)
 Addended by: DORINA DALLAS HERO on: 03/29/2024 08:45 AM   Modules accepted: Orders

## 2024-03-31 ENCOUNTER — Other Ambulatory Visit (HOSPITAL_COMMUNITY): Payer: Self-pay

## 2024-03-31 NOTE — Addendum Note (Signed)
 Addended by: DORINA DALLAS HERO on: 03/31/2024 12:00 PM   Modules accepted: Orders

## 2024-04-11 ENCOUNTER — Ambulatory Visit: Payer: PRIVATE HEALTH INSURANCE

## 2024-04-18 ENCOUNTER — Other Ambulatory Visit: Payer: Self-pay | Admitting: Medical

## 2024-04-18 ENCOUNTER — Other Ambulatory Visit: Payer: Self-pay

## 2024-04-18 ENCOUNTER — Other Ambulatory Visit (HOSPITAL_BASED_OUTPATIENT_CLINIC_OR_DEPARTMENT_OTHER): Payer: Self-pay

## 2024-04-18 NOTE — Telephone Encounter (Signed)
 Do you want Pt to continue B12 injections?

## 2024-04-19 NOTE — Addendum Note (Signed)
 Addended by: DORINA DALLAS HERO on: 04/19/2024 07:37 AM   Modules accepted: Orders

## 2024-05-30 ENCOUNTER — Other Ambulatory Visit (HOSPITAL_BASED_OUTPATIENT_CLINIC_OR_DEPARTMENT_OTHER): Payer: Self-pay

## 2024-06-16 ENCOUNTER — Ambulatory Visit: Payer: PRIVATE HEALTH INSURANCE

## 2024-06-16 ENCOUNTER — Ambulatory Visit
Admission: RE | Admit: 2024-06-16 | Discharge: 2024-06-16 | Disposition: A | Discharge status: Home / Self Care | Payer: PRIVATE HEALTH INSURANCE | Source: Ambulatory Visit | Attending: Family Medicine

## 2024-06-16 VITALS — BP 122/82 | HR 109 | Temp 99.1°F | Resp 16

## 2024-06-16 DIAGNOSIS — J039 Acute tonsillitis, unspecified: Secondary | ICD-10-CM

## 2024-06-16 LAB — POCT RAPID STREP A (OFFICE): Rapid Strep A Screen: POSITIVE — AB

## 2024-06-16 MED ORDER — AMOXICILLIN 875 MG PO TABS: 875.0000 mg | ORAL_TABLET | Freq: Two times a day (BID) | ORAL | 0 refills | Status: AC

## 2024-06-16 NOTE — Discharge Instructions (Addendum)
 Start amoxicillin  to help with tonsillitis. Use ibuprofen  800mg  every 8 hours for pain and inflammation.

## 2024-06-16 NOTE — ED Triage Notes (Signed)
 Pt reports she has tonsillitis, body aches, pain under right ear x 1 day. Reports she feels shivers. OTC meds gives no relief.

## 2024-06-16 NOTE — ED Provider Notes (Signed)
 [facility]  Note:  This document was prepared using Conservation officer, historic buildings and may include unintentional dictation errors.  MRN: [id number] DOB: 28-Dec-1980  Subjective:   Ann Everett is a 43 y.o. female presenting for 1 day history of right-sided throat pain, painful swallowing, pain along the internal aspect of her right ear that radiates to the neck.  Has had chills and shivers.  Current Outpatient Medications  Medication Instructions   cyanocobalamin  (VITAMIN B12) 1000 MCG/ML injection Inject 1 ml weekly for 4 weeks   SYRINGE-NEEDLE, DISP, 3 ML 25G X 5/8 3 ML MISC Use to inject B-12 weekly.   Vitamin D  (Ergocalciferol ) (DRISDOL ) 50,000 Units, Oral, Every 7 days    Allergies[1]  Past Medical History:  Diagnosis Date   Infection    UTI     Past Surgical History:  Procedure Laterality Date   WISDOM TOOTH EXTRACTION  2013    Family History  Problem Relation Age of Onset   Dementia Father     Social History   Occupational History   Occupation: MEDICAL LAB TECH  Tobacco Use   Smoking status: Never   Smokeless tobacco: Never  Vaping Use   Vaping status: Never Used  Substance and Sexual Activity   Alcohol use: No   Drug use: Never   Sexual activity: Yes    Partners: Male    Birth control/protection: Condom     ROS   Objective:   Vitals: BP 122/82 (BP Location: Left Arm)   Pulse (!) 109   Temp 99.1 F (37.3 C) (Oral)   Resp 16   LMP 05/23/2024 (Exact Date)   SpO2 98%   Physical Exam Constitutional:      General: She is not in acute distress.    Appearance: Normal appearance. She is well-developed. She is not ill-appearing, toxic-appearing or diaphoretic.  HENT:     Head: Normocephalic and atraumatic.     Nose: Nose normal.     Mouth/Throat:     Mouth: Mucous membranes are moist.     Pharynx: Pharyngeal swelling, oropharyngeal exudate and posterior oropharyngeal erythema present. No uvula swelling.      Tonsils: Tonsillar exudate present. No tonsillar abscesses. 2+ on the right. 0 on the left.  Eyes:     General: No scleral icterus.       Right eye: No discharge.        Left eye: No discharge.     Extraocular Movements: Extraocular movements intact.  Cardiovascular:     Rate and Rhythm: Normal rate.  Pulmonary:     Effort: Pulmonary effort is normal.  Skin:    General: Skin is warm and dry.  Neurological:     General: No focal deficit present.     Mental Status: She is alert and oriented to person, place, and time.  Psychiatric:        Mood and Affect: Mood normal.        Behavior: Behavior normal.     Results for orders placed or performed during the hospital encounter of 06/16/24 (from the past 24 hours)  POCT rapid strep A     Status: Normal   Collection Time: 06/16/24  5:21 PM  Result Value Ref Range   Rapid Strep A Screen  Negative    Assessment and Plan :   PDMP not reviewed this encounter.  1. Acute tonsillitis, unspecified etiology      Will treat for acute tonsillitis due to strep with amoxicillin .  Use supportive care otherwise.  Discussed signs and symptoms warranting an ER visit for tonsillar abscess, retropharyngeal abscess.    [1] No Known Allergies    Christopher Savannah, PA-C 06/16/24 1815

## 2024-07-18 ENCOUNTER — Other Ambulatory Visit (INDEPENDENT_AMBULATORY_CARE_PROVIDER_SITE_OTHER): Payer: PRIVATE HEALTH INSURANCE

## 2024-07-18 DIAGNOSIS — R739 Hyperglycemia, unspecified: Secondary | ICD-10-CM

## 2024-07-18 DIAGNOSIS — E559 Vitamin D deficiency, unspecified: Secondary | ICD-10-CM

## 2024-07-18 DIAGNOSIS — E538 Deficiency of other specified B group vitamins: Secondary | ICD-10-CM

## 2024-07-18 NOTE — Addendum Note (Signed)
 Addended by: ESTELLE GILLIS D on: 07/18/2024 11:34 AM   Modules accepted: Orders

## 2024-07-19 LAB — VITAMIN B12: Vitamin B-12: 527 pg/mL (ref 200–1100)

## 2024-07-19 LAB — HEMOGLOBIN A1C
Hgb A1c MFr Bld: 5.7 % — ABNORMAL HIGH
Mean Plasma Glucose: 117 mg/dL
eAG (mmol/L): 6.5 mmol/L

## 2024-07-19 LAB — VITAMIN D 25 HYDROXY (VIT D DEFICIENCY, FRACTURES): Vit D, 25-Hydroxy: 68 ng/mL (ref 30–100)

## 2024-07-21 ENCOUNTER — Telehealth: Payer: Self-pay | Admitting: Medical

## 2024-07-21 MED ORDER — METFORMIN HCL 500 MG PO TABS: 500.0000 mg | ORAL_TABLET | Freq: Two times a day (BID) | ORAL | 3 refills | Status: AC

## 2024-07-21 MED ORDER — METFORMIN HCL 500 MG PO TABS
500.0000 mg | ORAL_TABLET | Freq: Two times a day (BID) | ORAL | 3 refills | Status: AC
  Filled 2024-07-21: qty 180, 90d supply, fill #0

## 2024-07-21 NOTE — Addendum Note (Signed)
 Addended by: DORINA DALLAS DORINA PA-C M on: 07/21/2024 08:21 PM   Modules accepted: Orders

## 2024-07-21 NOTE — Addendum Note (Signed)
 Addended by: DORINA DALLAS DORINA PA-C M on: 07/21/2024 08:18 PM   Modules accepted: Orders

## 2024-07-21 NOTE — Telephone Encounter (Signed)
 Will you cancel metformin  sent to [facility]. Sent rx to [facility] which is what she request. Also advise pt to start tab 1 tab daily for 2 weeks then advance to twice daily. This will avoid gi side effects.

## 2024-07-22 ENCOUNTER — Other Ambulatory Visit (HOSPITAL_BASED_OUTPATIENT_CLINIC_OR_DEPARTMENT_OTHER): Payer: Self-pay
